# Patient Record
Sex: Male | Born: 1970 | Race: White | Hispanic: Yes | Marital: Married | Smoking: Never smoker
Health system: Southern US, Community
[De-identification: ages and names within clinical notes are randomized; demographics above are authoritative.]

## PROBLEM LIST (undated history)

## (undated) DIAGNOSIS — I10 Essential (primary) hypertension: Secondary | ICD-10-CM

## (undated) DIAGNOSIS — F419 Anxiety disorder, unspecified: Secondary | ICD-10-CM

## (undated) DIAGNOSIS — N189 Chronic kidney disease, unspecified: Secondary | ICD-10-CM

## (undated) DIAGNOSIS — Q613 Polycystic kidney, unspecified: Secondary | ICD-10-CM

## (undated) DIAGNOSIS — F4310 Post-traumatic stress disorder, unspecified: Secondary | ICD-10-CM

## (undated) DIAGNOSIS — E1129 Type 2 diabetes mellitus with other diabetic kidney complication: Secondary | ICD-10-CM

## (undated) DIAGNOSIS — N184 Chronic kidney disease, stage 4 (severe): Secondary | ICD-10-CM

## (undated) DIAGNOSIS — E119 Type 2 diabetes mellitus without complications: Secondary | ICD-10-CM

## (undated) HISTORY — DX: Essential (primary) hypertension: I10

## (undated) HISTORY — DX: Chronic kidney disease, unspecified: N18.9

## (undated) HISTORY — PX: FOREIGN BODY REMOVAL: SHX962

---

## 2019-03-19 ENCOUNTER — Ambulatory Visit
Admission: EM | Admit: 2019-03-19 | Discharge: 2019-03-19 | Disposition: A | Discharge status: Home / Self Care | Payer: BC Managed Care – PPO | Attending: Physician Assistant | Admitting: Physician Assistant

## 2019-03-19 DIAGNOSIS — R3129 Other microscopic hematuria: Secondary | ICD-10-CM | POA: Diagnosis present

## 2019-03-19 DIAGNOSIS — R82998 Other abnormal findings in urine: Secondary | ICD-10-CM | POA: Diagnosis not present

## 2019-03-19 HISTORY — DX: Type 2 diabetes mellitus without complications: E11.9

## 2019-03-19 HISTORY — DX: Polycystic kidney, unspecified: Q61.3

## 2019-03-19 LAB — POCT URINALYSIS DIP (MANUAL ENTRY)
Bilirubin, UA: NEGATIVE
Glucose, UA: NEGATIVE mg/dL
Ketones, POC UA: NEGATIVE mg/dL
Nitrite, UA: NEGATIVE
Protein Ur, POC: 100 mg/dL — AB
Spec Grav, UA: 1.025 (ref 1.010–1.025)
Urobilinogen, UA: 0.2 E.U./dL
pH, UA: 5.5 (ref 5.0–8.0)

## 2019-03-19 MED ORDER — CEPHALEXIN 500 MG PO CAPS: 500.0000 mg | ORAL_CAPSULE | Freq: Three times a day (TID) | ORAL | 0 refills | Status: DC

## 2019-03-19 NOTE — Discharge Instructions (Signed)
Urine showed mild bacteria, blood. Start keflex as directed. Keep hydrated, urine should be clear to pale yellow in color. Continue to monitor symptoms. If right lower quadrant pain returns, nausea/vomiting, fever, go to the emergency department for further evaluation needed.

## 2019-03-19 NOTE — ED Triage Notes (Signed)
Pt c/o rt flank pain that has now subsided, c/o dark urine x3 days

## 2019-03-19 NOTE — ED Provider Notes (Signed)
[facility]    CSN: [id number] Arrival date & time: 03/19/19  1012      History   Chief Complaint Chief Complaint  Patient presents with  . Flank Pain    HPI Jeffery Hobbs is a 49 y.o. male.   49 year old male with history of diabetes, polycystic kidneys comes in for 3-day history of dark urine.  States in the past, when this happens, can be developing urinary tract infection.  Started having right flank pain/RLQ during work, that has now resolved.  States this is usually when one of his cysts ruptured.  He recently moved from [place] to [place], and talked with his nephrologist back in [place], who recommended dipstick and antibiotic for symptoms.  His creatinine is being monitored by nephrologist, and states has been stable.  Diabetes has been stable, used to be on insulin, which has been discontinued due to good control.  Last A1c 6.2.  Denies nausea, vomiting.  Denies fever, chills, body aches.  Denies dysuria, urinary frequency.  Feels that dark urine is due to hematuria.  Has been increasing fluid intake without relief.      Past Medical History:  Diagnosis Date  . Diabetes mellitus without complication ([place])   . Polycystic kidney     There are no problems to display for this patient.   History reviewed. No pertinent surgical history.     Home Medications    Prior to Admission medications   Medication Sig Start Date End Date Taking? Authorizing Provider  amLODipine (NORVASC) 10 MG tablet Take 10 mg by mouth daily.   Yes [provider]  aspirin EC 81 MG tablet Take 81 mg by mouth daily.   Yes [provider]  cholecalciferol (VITAMIN D3) 25 MCG (1000 UNIT) tablet Take 1,000 Units by mouth daily.   Yes [provider]  glipiZIDE (GLUCOTROL) 10 MG tablet Take 5 mg by mouth daily before breakfast.   Yes [provider]  cephALEXin (KEFLEX) 500 MG capsule Take 1 capsule (500 mg total) by mouth 3 (three) times daily.  03/19/19   Ok Edwards, PA-C    Family History History reviewed. No pertinent family history.  Social History Social History   Tobacco Use  . Smoking status: Never Smoker  . Smokeless tobacco: Never Used  Substance Use Topics  . Alcohol use: Yes    Comment: socia   . Drug use: Not Currently     Allergies   Patient has no known allergies.   Review of Systems Review of Systems  Reason unable to perform ROS: See HPI as above.     Physical Exam Triage Vital Signs ED Triage Vitals  Enc Vitals Group     BP 03/19/19 1024 137/85     Pulse Rate 03/19/19 1024 (!) 106     Resp 03/19/19 1024 18     Temp 03/19/19 1024 98.6 F (37 C)     Temp Source 03/19/19 1024 Oral     SpO2 03/19/19 1024 95 %     Weight --      Height --      Head Circumference --      Peak Flow --      Pain Score 03/19/19 1025 0     Pain Loc --      Pain Edu? --      Excl. in [place]? --    No data found.  Updated Vital Signs BP 137/85 (BP Location: Left Arm)   Pulse (!) 106  Temp 98.6 F (37 C) (Oral)   Resp 18   SpO2 95%   Physical Exam Constitutional:      General: He is not in acute distress.    Appearance: Normal appearance. He is not ill-appearing, toxic-appearing or diaphoretic.  HENT:     Head: Normocephalic and atraumatic.  Cardiovascular:     Rate and Rhythm: Normal rate and regular rhythm.  Pulmonary:     Effort: Pulmonary effort is normal. No respiratory distress.     Comments: LCTAB Abdominal:     General: Bowel sounds are normal.     Palpations: Abdomen is soft.     Tenderness: There is no abdominal tenderness. There is no right CVA tenderness, left CVA tenderness, guarding or rebound.  Musculoskeletal:     Cervical back: Normal range of motion and neck supple.  Skin:    General: Skin is warm and dry.  Neurological:     Mental Status: He is alert and oriented to person, place, and time.      UC Treatments / Results  Labs (all labs ordered are listed, but only abnormal  results are displayed) Labs Reviewed  POCT URINALYSIS DIP (MANUAL ENTRY) - Abnormal; Notable for the following components:      Result Value   Color, UA other (*)    Clarity, UA cloudy (*)    Blood, UA large (*)    Protein Ur, POC =100 (*)    Leukocytes, UA Trace (*)    All other components within normal limits  URINE CULTURE    EKG   Radiology No results found.  Procedures Procedures (including critical care time)  Medications Ordered in UC Medications - No data to display  Initial Impression / Assessment and Plan / UC Course  I have reviewed the triage vital signs and the nursing notes.  Pertinent labs & imaging results that were available during my care of the patient were reviewed by me and considered in my medical decision making (see chart for details).    Dipstick with large blood, trace leukocytes.  Given history, will cover for cystitis with Keflex.  Urine culture sent.  Patient had endorsed some right lower quadrant pain when having right flank pain, both have resolved at this time.  Abdomen soft, nontender.  Push fluids.  Return precautions given.  Patient expresses understanding and agrees to plan.  Final Clinical Impressions(s) / UC Diagnoses   Final diagnoses:  Other microscopic hematuria  Dark urine    ED Prescriptions    Medication Sig Dispense Auth. Provider   cephALEXin (KEFLEX) 500 MG capsule Take 1 capsule (500 mg total) by mouth 3 (three) times daily. 21 capsule Ok Edwards, PA-C     PDMP not reviewed this encounter.   Ok Edwards, PA-C 03/19/19 1055

## 2019-03-21 LAB — URINE CULTURE: Culture: 30000 — AB

## 2019-04-13 ENCOUNTER — Ambulatory Visit
Admission: EM | Admit: 2019-04-13 | Discharge: 2019-04-13 | Disposition: A | Discharge status: Home / Self Care | Payer: BC Managed Care – PPO | Attending: Family Medicine | Admitting: Family Medicine

## 2019-04-13 DIAGNOSIS — G8929 Other chronic pain: Secondary | ICD-10-CM

## 2019-04-13 DIAGNOSIS — M25561 Pain in right knee: Secondary | ICD-10-CM | POA: Diagnosis not present

## 2019-04-13 DIAGNOSIS — R319 Hematuria, unspecified: Secondary | ICD-10-CM

## 2019-04-13 LAB — POCT URINALYSIS DIP (MANUAL ENTRY)
Bilirubin, UA: NEGATIVE
Glucose, UA: NEGATIVE mg/dL
Ketones, POC UA: NEGATIVE mg/dL
Leukocytes, UA: NEGATIVE
Nitrite, UA: NEGATIVE
Protein Ur, POC: 100 mg/dL — AB
Spec Grav, UA: >=1.03 — AB (ref 1.010–1.025)
Urobilinogen, UA: 0.2 E.U./dL
pH, UA: 6 (ref 5.0–8.0)

## 2019-04-13 MED ORDER — TRAMADOL HCL 50 MG PO TABS: 50.0000 mg | ORAL_TABLET | Freq: Four times a day (QID) | ORAL | 0 refills | Status: AC | PRN

## 2019-04-13 MED ORDER — PREDNISONE 10 MG (21) PO TBPK: ORAL_TABLET | ORAL | 0 refills | Status: DC

## 2019-04-13 NOTE — ED Triage Notes (Signed)
Pt c/o lt knee pain with swelling x3 days. Denies injury, states chronic arthritis to that knee. Pt c/o lt flank pain with urinary difficulty with decrease flow and cloudy to pink in color. Hx of kidney stones.

## 2019-04-13 NOTE — Discharge Instructions (Addendum)
Treating you for knee pain and swelling due to your osteoarthritis  Wear the ace wrap, rest, ice, compression.  Prednisone daily with food for 6 days Tramadol for more severe pain.  Make sure that you are drinking plenty of fluids.  Contacts given for primary care, orthopedics and nephrology.

## 2019-04-13 NOTE — ED Provider Notes (Signed)
[facility]    CSN: [id number] Arrival date & time: 04/13/19  1131      History   Chief Complaint Chief Complaint  Patient presents with  . Knee Pain  . Flank Pain    HPI Jeffery Hobbs is a 49 y.o. male.   Pt is a 49 year old male that presents today with right knee pain swelling.  This is been constant and worsening of the past 3 days.  Reporting pretty labored struggle with standing and wearing heavy vest and done at work.  Has history of chronic pain in that knee due to osteoarthritis.  Has had steroid injections in the knee before.  Currently has not take anything for this flare.  Has any injuries to the knee.  Any erythema or fevers.  Reports has been told he needs a knee replacement in the future  Patient also having left flank pain.  This has been a chronic problem for him also has been coming and going.  Was seen here on 3 1 with similar symptoms and treated for possible kidney stone and urinary tract infection.  Reporting had improvement after the treatment.  He has had decreased urinary flow and discolored urine.  Reports the past couple days the urine color has cleared up some.  Initially was cranberry colored.  Denies any nausea, vomiting or diarrhea.  ROS per HPI      Past Medical History:  Diagnosis Date  . Diabetes mellitus without complication ([place])   . Polycystic kidney     There are no problems to display for this patient.   History reviewed. No pertinent surgical history.     Home Medications    Prior to Admission medications   Medication Sig Start Date End Date Taking? Authorizing Provider  amLODipine (NORVASC) 10 MG tablet Take 10 mg by mouth daily.    [provider]  aspirin EC 81 MG tablet Take 81 mg by mouth daily.    [provider]  cholecalciferol (VITAMIN D3) 25 MCG (1000 UNIT) tablet Take 1,000 Units by mouth daily.    [provider]  glipiZIDE (GLUCOTROL) 10 MG tablet Take 5 mg by mouth daily  before breakfast.    [provider]  predniSONE (STERAPRED UNI-PAK 21 TAB) 10 MG (21) TBPK tablet 6 tabs for 1 day, then 5 tabs for 1 das, then 4 tabs for 1 day, then 3 tabs for 1 day, 2 tabs for 1 day, then 1 tab for 1 day 04/13/19   Loura Halt A, NP  traMADol (ULTRAM) 50 MG tablet Take 1 tablet (50 mg total) by mouth every 6 (six) hours as needed for up to 3 days. 04/13/19 04/16/19  Orvan July, NP    Family History History reviewed. No pertinent family history.  Social History Social History   Tobacco Use  . Smoking status: Never Smoker  . Smokeless tobacco: Never Used  Substance Use Topics  . Alcohol use: Yes    Comment: socia   . Drug use: Not Currently     Allergies   Patient has no known allergies.   Review of Systems Review of Systems   Physical Exam Triage Vital Signs ED Triage Vitals [04/13/19 1146]  Enc Vitals Group     BP (!) 163/98     Pulse Rate 94     Resp 16     Temp 98.4 F (36.9 C)     Temp Source Oral     SpO2 96 %  Weight      Height      Head Circumference      Peak Flow      Pain Score 10     Pain Loc      Pain Edu?      Excl. in [place]?    No data found.  Updated Vital Signs BP (!) 163/98 (BP Location: Left Arm)   Pulse 94   Temp 98.4 F (36.9 C) (Oral)   Resp 16   SpO2 96%   Visual Acuity Right Eye Distance:   Left Eye Distance:   Bilateral Distance:    Right Eye Near:   Left Eye Near:    Bilateral Near:     Physical Exam Vitals and nursing note reviewed.  Constitutional:      General: He is not in acute distress.    Appearance: Normal appearance. He is not ill-appearing, toxic-appearing or diaphoretic.  HENT:     Head: Normocephalic and atraumatic.  Pulmonary:     Effort: Pulmonary effort is normal.  Abdominal:     Tenderness: There is left CVA tenderness.  Musculoskeletal:        General: Swelling and tenderness present.     Cervical back: Normal range of motion.     Right knee: Swelling and bony  tenderness present. Decreased range of motion. Tenderness present over the MCL.       Legs:  Skin:    General: Skin is warm and dry.  Neurological:     Mental Status: He is alert.  Psychiatric:        Mood and Affect: Mood normal.      UC Treatments / Results  Labs (all labs ordered are listed, but only abnormal results are displayed) Labs Reviewed  POCT URINALYSIS DIP (MANUAL ENTRY) - Abnormal; Notable for the following components:      Result Value   Spec Grav, UA >=1.030 (*)    Blood, UA large (*)    Protein Ur, POC =100 (*)    All other components within normal limits    EKG   Radiology No results found.  Procedures Procedures (including critical care time)  Medications Ordered in UC Medications - No data to display  Initial Impression / Assessment and Plan / UC Course  I have reviewed the triage vital signs and the nursing notes.  Pertinent labs & imaging results that were available during my care of the patient were reviewed by me and considered in my medical decision making (see chart for details).     Chronic right knee pain due to osteoarthritis-patient with increased pain and swelling over the past 3 days. Does not currently have an orthopedic specialist here in [place].  [place] of state. Will treat with Ace wrap for compression, rest, ice and elevation. Prednisone for pain, inflammation and swelling. Tramadol for severe pain as needed  Left flank pain-, urine with large blood.  No leukocytes or signs of infection. Most likely kidney stone.  Patient has a history of polycystic kidney disease.  He does not have a nephrologist here. Gave patient contact for primary care follow-up for referrals to specialist. Follow up as needed for continued or worsening symptoms  Final Clinical Impressions(s) / UC Diagnoses   Final diagnoses:  Chronic pain of right knee  Hematuria, unspecified type     Discharge Instructions      Treating you for knee pain and swelling due to your osteoarthritis  Wear the ace wrap, rest,  ice, compression.  Prednisone daily with food for 6 days Tramadol for more severe pain.  Make sure that you are drinking plenty of fluids.  Contacts given for primary care, orthopedics and nephrology.      ED Prescriptions    Medication Sig Dispense Auth. Provider   predniSONE (STERAPRED UNI-PAK 21 TAB) 10 MG (21) TBPK tablet 6 tabs for 1 day, then 5 tabs for 1 das, then 4 tabs for 1 day, then 3 tabs for 1 day, 2 tabs for 1 day, then 1 tab for 1 day 21 tablet Mohit Zirbes A, NP   traMADol (ULTRAM) 50 MG tablet Take 1 tablet (50 mg total) by mouth every 6 (six) hours as needed for up to 3 days. 12 tablet Laban Orourke A, NP     I have reviewed the PDMP during this encounter.   Loura Halt A, NP 04/13/19 1328

## 2019-04-23 ENCOUNTER — Other Ambulatory Visit: Payer: Self-pay | Admitting: Sports Medicine

## 2019-04-23 DIAGNOSIS — S59902A Unspecified injury of left elbow, initial encounter: Secondary | ICD-10-CM

## 2019-04-24 ENCOUNTER — Other Ambulatory Visit: Payer: Self-pay

## 2019-04-24 ENCOUNTER — Ambulatory Visit
Admission: RE | Admit: 2019-04-24 | Discharge: 2019-04-24 | Disposition: A | Discharge status: Home / Self Care | Payer: BC Managed Care – PPO | Source: Ambulatory Visit | Attending: Sports Medicine | Admitting: Sports Medicine

## 2019-04-24 DIAGNOSIS — S59902A Unspecified injury of left elbow, initial encounter: Secondary | ICD-10-CM | POA: Diagnosis not present

## 2019-04-25 ENCOUNTER — Other Ambulatory Visit: Payer: Self-pay | Admitting: Orthopedic Surgery

## 2019-04-27 ENCOUNTER — Other Ambulatory Visit
Admission: RE | Admit: 2019-04-27 | Discharge: 2019-04-27 | Disposition: A | Discharge status: Home / Self Care | Payer: BC Managed Care – PPO | Source: Ambulatory Visit | Attending: Orthopedic Surgery | Admitting: Orthopedic Surgery

## 2019-04-27 DIAGNOSIS — Z01812 Encounter for preprocedural laboratory examination: Secondary | ICD-10-CM | POA: Diagnosis not present

## 2019-04-27 DIAGNOSIS — Z20822 Contact with and (suspected) exposure to covid-19: Secondary | ICD-10-CM | POA: Diagnosis not present

## 2019-04-28 LAB — SARS CORONAVIRUS 2 (TAT 6-24 HRS): SARS Coronavirus 2: NEGATIVE

## 2019-04-30 ENCOUNTER — Ambulatory Visit: Payer: BC Managed Care – PPO | Admitting: Anesthesiology

## 2019-04-30 ENCOUNTER — Encounter: Payer: Self-pay | Admitting: Orthopedic Surgery

## 2019-04-30 ENCOUNTER — Other Ambulatory Visit: Payer: Self-pay

## 2019-04-30 ENCOUNTER — Ambulatory Visit
Admission: RE | Admit: 2019-04-30 | Discharge: 2019-04-30 | Disposition: A | Discharge status: Home / Self Care | Payer: BC Managed Care – PPO | Attending: Orthopedic Surgery | Admitting: Orthopedic Surgery

## 2019-04-30 ENCOUNTER — Encounter
Admission: RE | Disposition: A | Discharge status: Home / Self Care | Payer: Self-pay | Source: Home / Self Care | Attending: Orthopedic Surgery

## 2019-04-30 DIAGNOSIS — X500XXA Overexertion from strenuous movement or load, initial encounter: Secondary | ICD-10-CM | POA: Insufficient documentation

## 2019-04-30 DIAGNOSIS — Z7984 Long term (current) use of oral hypoglycemic drugs: Secondary | ICD-10-CM | POA: Insufficient documentation

## 2019-04-30 DIAGNOSIS — E119 Type 2 diabetes mellitus without complications: Secondary | ICD-10-CM | POA: Insufficient documentation

## 2019-04-30 DIAGNOSIS — Z7982 Long term (current) use of aspirin: Secondary | ICD-10-CM | POA: Diagnosis not present

## 2019-04-30 DIAGNOSIS — Z79899 Other long term (current) drug therapy: Secondary | ICD-10-CM | POA: Diagnosis not present

## 2019-04-30 DIAGNOSIS — S46312A Strain of muscle, fascia and tendon of triceps, left arm, initial encounter: Secondary | ICD-10-CM | POA: Insufficient documentation

## 2019-04-30 HISTORY — DX: Post-traumatic stress disorder, unspecified: F43.10

## 2019-04-30 HISTORY — PX: TRICEPS TENDON REPAIR: SHX2577

## 2019-04-30 HISTORY — DX: Anxiety disorder, unspecified: F41.9

## 2019-04-30 LAB — CBC
HCT: 42.5 % (ref 39.0–52.0)
Hemoglobin: 14 g/dL (ref 13.0–17.0)
MCH: 28.7 pg (ref 26.0–34.0)
MCHC: 32.9 g/dL (ref 30.0–36.0)
MCV: 87.3 fL (ref 80.0–100.0)
Platelets: 359 10*3/uL (ref 150–400)
RBC: 4.87 MIL/uL (ref 4.22–5.81)
RDW: 13.2 % (ref 11.5–15.5)
WBC: 6.9 10*3/uL (ref 4.0–10.5)
nRBC: 0 % (ref 0.0–0.2)

## 2019-04-30 LAB — BASIC METABOLIC PANEL
Anion gap: 8 (ref 5–15)
BUN: 33 mg/dL — ABNORMAL HIGH (ref 6–20)
CO2: 22 mmol/L (ref 22–32)
Calcium: 9 mg/dL (ref 8.9–10.3)
Chloride: 104 mmol/L (ref 98–111)
Creatinine, Ser: 2.28 mg/dL — ABNORMAL HIGH (ref 0.61–1.24)
GFR calc Af Amer: 37 mL/min — ABNORMAL LOW (ref >60)
GFR calc non Af Amer: 32 mL/min — ABNORMAL LOW (ref >60)
Glucose, Bld: 192 mg/dL — ABNORMAL HIGH (ref 70–99)
Potassium: 4.4 mmol/L (ref 3.5–5.1)
Sodium: 134 mmol/L — ABNORMAL LOW (ref 135–145)

## 2019-04-30 LAB — GLUCOSE, CAPILLARY
Glucose-Capillary: 180 mg/dL — ABNORMAL HIGH (ref 70–99)
Glucose-Capillary: 161 mg/dL — ABNORMAL HIGH (ref 70–99)

## 2019-04-30 SURGERY — REPAIR, TENDON, TRICEPS
Anesthesia: General | Site: Arm Upper | Laterality: Left

## 2019-04-30 MED ORDER — OXYCODONE HCL 5 MG PO TABS
ORAL_TABLET | ORAL | Status: AC
  Filled 2019-04-30: qty 1

## 2019-04-30 MED ORDER — EPHEDRINE SULFATE 50 MG/ML IJ SOLN
INTRAMUSCULAR | Status: DC | PRN
  Administered 2019-04-30: 10 mg via INTRAVENOUS

## 2019-04-30 MED ORDER — OXYCODONE HCL 5 MG PO TABS
5.0000 mg | ORAL_TABLET | ORAL | Status: DC | PRN
  Administered 2019-04-30: 5 mg via ORAL
  Filled 2019-04-30: qty 2

## 2019-04-30 MED ORDER — FENTANYL CITRATE (PF) 100 MCG/2ML IJ SOLN
INTRAMUSCULAR | Status: DC | PRN
  Administered 2019-04-30 (×2): 50 ug via INTRAVENOUS

## 2019-04-30 MED ORDER — FENTANYL CITRATE (PF) 100 MCG/2ML IJ SOLN
INTRAMUSCULAR | Status: AC
  Administered 2019-04-30: 25 ug via INTRAVENOUS
  Filled 2019-04-30: qty 2

## 2019-04-30 MED ORDER — ACETAMINOPHEN 500 MG PO TABS: 1000.0000 mg | ORAL_TABLET | Freq: Three times a day (TID) | ORAL | 2 refills | Status: DC

## 2019-04-30 MED ORDER — ROCURONIUM BROMIDE 10 MG/ML (PF) SYRINGE
PREFILLED_SYRINGE | INTRAVENOUS | Status: AC
  Filled 2019-04-30: qty 10

## 2019-04-30 MED ORDER — ASPIRIN EC 325 MG PO TBEC: 325.0000 mg | DELAYED_RELEASE_TABLET | Freq: Every day | ORAL | 0 refills | Status: AC

## 2019-04-30 MED ORDER — ONDANSETRON HCL 4 MG/2ML IJ SOLN
INTRAMUSCULAR | Status: AC
  Filled 2019-04-30: qty 2

## 2019-04-30 MED ORDER — MIDAZOLAM HCL 2 MG/2ML IJ SOLN
INTRAMUSCULAR | Status: AC
  Filled 2019-04-30: qty 2

## 2019-04-30 MED ORDER — FAMOTIDINE 20 MG PO TABS: 20.0000 mg | ORAL_TABLET | Freq: Once | ORAL | Status: AC

## 2019-04-30 MED ORDER — PROPOFOL 10 MG/ML IV BOLUS
INTRAVENOUS | Status: AC
  Filled 2019-04-30: qty 40

## 2019-04-30 MED ORDER — PHENYLEPHRINE HCL (PRESSORS) 10 MG/ML IV SOLN
INTRAVENOUS | Status: DC | PRN
  Administered 2019-04-30 (×4): 100 ug via INTRAVENOUS

## 2019-04-30 MED ORDER — ROCURONIUM BROMIDE 100 MG/10ML IV SOLN
INTRAVENOUS | Status: DC | PRN
  Administered 2019-04-30: 50 mg via INTRAVENOUS
  Administered 2019-04-30: 20 mg via INTRAVENOUS

## 2019-04-30 MED ORDER — LACTATED RINGERS IV SOLN: INTRAVENOUS | Status: DC

## 2019-04-30 MED ORDER — PROPOFOL 10 MG/ML IV BOLUS
INTRAVENOUS | Status: DC | PRN
  Administered 2019-04-30: 200 mg via INTRAVENOUS

## 2019-04-30 MED ORDER — CEFAZOLIN SODIUM-DEXTROSE 2-4 GM/100ML-% IV SOLN
INTRAVENOUS | Status: AC
  Filled 2019-04-30: qty 100

## 2019-04-30 MED ORDER — BUPIVACAINE LIPOSOME 1.3 % IJ SUSP
INTRAMUSCULAR | Status: AC
  Filled 2019-04-30: qty 20

## 2019-04-30 MED ORDER — NEOMYCIN-POLYMYXIN B GU 40-200000 IR SOLN
Status: DC | PRN
  Administered 2019-04-30: 4 mL

## 2019-04-30 MED ORDER — FAMOTIDINE 20 MG PO TABS
ORAL_TABLET | ORAL | Status: AC
  Administered 2019-04-30: 20 mg via ORAL
  Filled 2019-04-30: qty 1

## 2019-04-30 MED ORDER — BUPIVACAINE LIPOSOME 1.3 % IJ SUSP
INTRAMUSCULAR | Status: DC | PRN
  Administered 2019-04-30: 11 mL

## 2019-04-30 MED ORDER — ONDANSETRON HCL 4 MG/2ML IJ SOLN: 4.0000 mg | Freq: Once | INTRAMUSCULAR | Status: DC | PRN

## 2019-04-30 MED ORDER — BUPIVACAINE HCL (PF) 0.5 % IJ SOLN
INTRAMUSCULAR | Status: AC
  Filled 2019-04-30: qty 30

## 2019-04-30 MED ORDER — BUPIVACAINE HCL (PF) 0.5 % IJ SOLN
INTRAMUSCULAR | Status: DC | PRN
  Administered 2019-04-30: 11 mL

## 2019-04-30 MED ORDER — LIDOCAINE HCL (CARDIAC) PF 100 MG/5ML IV SOSY
PREFILLED_SYRINGE | INTRAVENOUS | Status: DC | PRN
  Administered 2019-04-30: 100 mg via INTRAVENOUS

## 2019-04-30 MED ORDER — FENTANYL CITRATE (PF) 100 MCG/2ML IJ SOLN
INTRAMUSCULAR | Status: AC
  Filled 2019-04-30: qty 2

## 2019-04-30 MED ORDER — OXYCODONE HCL 5 MG PO TABS: 5.0000 mg | ORAL_TABLET | ORAL | 0 refills | Status: DC | PRN

## 2019-04-30 MED ORDER — DEXAMETHASONE SODIUM PHOSPHATE 10 MG/ML IJ SOLN
INTRAMUSCULAR | Status: DC | PRN
  Administered 2019-04-30: 5 mg via INTRAVENOUS

## 2019-04-30 MED ORDER — SUGAMMADEX SODIUM 200 MG/2ML IV SOLN
INTRAVENOUS | Status: DC | PRN
  Administered 2019-04-30: 200 mg via INTRAVENOUS

## 2019-04-30 MED ORDER — SODIUM CHLORIDE 0.9 % IV SOLN
INTRAVENOUS | Status: DC
  Administered 2019-04-30: 50 mL/h via INTRAVENOUS

## 2019-04-30 MED ORDER — ONDANSETRON 4 MG PO TBDP: 4.0000 mg | ORAL_TABLET | Freq: Three times a day (TID) | ORAL | 0 refills | Status: DC | PRN

## 2019-04-30 MED ORDER — DEXAMETHASONE SODIUM PHOSPHATE 10 MG/ML IJ SOLN
INTRAMUSCULAR | Status: AC
  Filled 2019-04-30: qty 1

## 2019-04-30 MED ORDER — MIDAZOLAM HCL 2 MG/2ML IJ SOLN
INTRAMUSCULAR | Status: DC | PRN
  Administered 2019-04-30: 2 mg via INTRAVENOUS

## 2019-04-30 MED ORDER — NEOMYCIN-POLYMYXIN B GU 40-200000 IR SOLN
Status: AC
  Filled 2019-04-30: qty 4

## 2019-04-30 MED ORDER — DEXMEDETOMIDINE HCL 200 MCG/2ML IV SOLN
INTRAVENOUS | Status: DC | PRN
  Administered 2019-04-30 (×2): 4 ug via INTRAVENOUS
  Administered 2019-04-30: 12 ug via INTRAVENOUS

## 2019-04-30 MED ORDER — FENTANYL CITRATE (PF) 100 MCG/2ML IJ SOLN
25.0000 ug | INTRAMUSCULAR | Status: DC | PRN
  Administered 2019-04-30 (×2): 25 ug via INTRAVENOUS

## 2019-04-30 MED ORDER — CEFAZOLIN SODIUM-DEXTROSE 2-4 GM/100ML-% IV SOLN
2.0000 g | INTRAVENOUS | Status: AC
  Administered 2019-04-30: 2 g via INTRAVENOUS

## 2019-04-30 MED ORDER — ONDANSETRON HCL 4 MG/2ML IJ SOLN
INTRAMUSCULAR | Status: DC | PRN
  Administered 2019-04-30: 4 mg via INTRAVENOUS

## 2019-04-30 MED ORDER — LIDOCAINE HCL (PF) 2 % IJ SOLN
INTRAMUSCULAR | Status: AC
  Filled 2019-04-30: qty 10

## 2019-04-30 SURGICAL SUPPLY — 57 items
BLADE SURG 15 STRL LF DISP TIS (BLADE) ×1 IMPLANT
BLADE SURG 15 STRL SS (BLADE) ×1
BLADE SURG SZ10 CARB STEEL (BLADE) ×4 IMPLANT
BNDG COHESIVE 4X5 TAN STRL (GAUZE/BANDAGES/DRESSINGS) ×2 IMPLANT
BNDG ESMARK 4X12 TAN STRL LF (GAUZE/BANDAGES/DRESSINGS) ×2 IMPLANT
BUR 4.8X51.2 (BURR) IMPLANT
CANISTER SUCT 1200ML W/VALVE (MISCELLANEOUS) ×2 IMPLANT
CHLORAPREP W/TINT 26 (MISCELLANEOUS) ×2 IMPLANT
COVER WAND RF STERILE (DRAPES) ×2 IMPLANT
CUFF TOURN SGL QUICK 18X4 (TOURNIQUET CUFF) IMPLANT
CUFF TOURN SGL QUICK 24 (TOURNIQUET CUFF) ×1
CUFF TRNQT CYL 24X4X16.5-23 (TOURNIQUET CUFF) IMPLANT
DRAPE INCISE IOBAN 66X60 STRL (DRAPES) ×4 IMPLANT
DRAPE SPLIT 6X30 W/TAPE (DRAPES) ×2 IMPLANT
ELECT REM PT RETURN 9FT ADLT (ELECTROSURGICAL) ×2
ELECTRODE REM PT RTRN 9FT ADLT (ELECTROSURGICAL) ×1 IMPLANT
GAUZE SPONGE 4X4 12PLY STRL (GAUZE/BANDAGES/DRESSINGS) ×3 IMPLANT
GAUZE XEROFORM 1X8 LF (GAUZE/BANDAGES/DRESSINGS) ×2 IMPLANT
GLOVE INDICATOR 8.0 STRL GRN (GLOVE) ×2 IMPLANT
GLOVE SURG ORTHO 8.0 STRL STRW (GLOVE) ×6 IMPLANT
GOWN STRL REUS W/ TWL LRG LVL3 (GOWN DISPOSABLE) ×1 IMPLANT
GOWN STRL REUS W/ TWL XL LVL3 (GOWN DISPOSABLE) ×1 IMPLANT
GOWN STRL REUS W/TWL LRG LVL3 (GOWN DISPOSABLE) ×1
GOWN STRL REUS W/TWL XL LVL3 (GOWN DISPOSABLE) ×1
IMP SYS 2ND FIX PEEK 4.75X19.1 (Miscellaneous) ×2 IMPLANT
IMPL SYS 2ND FX PEEK 4.75X19.1 (Miscellaneous) IMPLANT
KIT TURNOVER KIT A (KITS) ×2 IMPLANT
NDL FILTER BLUNT 18X1 1/2 (NEEDLE) ×1 IMPLANT
NDL MAYO CATGUT SZ5 (NEEDLE) ×1
NDL SUT 5 .5 CRC TPR PNT MAYO (NEEDLE) ×1 IMPLANT
NEEDLE FILTER BLUNT 18X 1/2SAF (NEEDLE) ×1
NEEDLE FILTER BLUNT 18X1 1/2 (NEEDLE) ×1 IMPLANT
NS IRRIG 500ML POUR BTL (IV SOLUTION) ×2 IMPLANT
PACK EXTREMITY ARMC (MISCELLANEOUS) ×2 IMPLANT
PAD ABD DERMACEA PRESS 5X9 (GAUZE/BANDAGES/DRESSINGS) ×2 IMPLANT
PADDING CAST BLEND 4X4 NS (MISCELLANEOUS) ×2 IMPLANT
PASSER SUT SWANSON 36MM LOOP (INSTRUMENTS) ×1 IMPLANT
PENCIL SMOKE EVACUATOR COATED (MISCELLANEOUS) ×1 IMPLANT
RETRIEVER SUT HEWSON (MISCELLANEOUS) ×2 IMPLANT
SLING ARM LRG DEEP (SOFTGOODS) ×3 IMPLANT
SPLINT FAST PLASTER 5X30 (CAST SUPPLIES) ×1
SPLINT PLASTER CAST FAST 5X30 (CAST SUPPLIES) ×1 IMPLANT
SPONGE LAP 18X18 RF (DISPOSABLE) ×2 IMPLANT
STAPLER SKIN PROX 35W (STAPLE) ×2 IMPLANT
STOCKINETTE IMPERVIOUS 9X36 MD (GAUZE/BANDAGES/DRESSINGS) ×2 IMPLANT
SUT FIBERWIRE #2 38 T-5 BLUE (SUTURE) ×2
SUT FIBERWIRE #5 38 BLUE (WIRE) ×3 IMPLANT
SUT VIC AB 1 CT1 36 (SUTURE) ×2 IMPLANT
SUT VIC AB 2-0 CT1 27 (SUTURE) ×2
SUT VIC AB 2-0 CT1 TAPERPNT 27 (SUTURE) ×2 IMPLANT
SUTURE FIBERWR #2 38 T-5 BLUE (SUTURE) ×1 IMPLANT
SUTURE TAPE 1.3 40 TPR END (SUTURE) ×2 IMPLANT
SUTURE TAPE FIBERLINK 1.3 LOOP (SUTURE) ×1 IMPLANT
SUTURETAPE 1.3 40 TPR END (SUTURE) ×4
SUTURETAPE FIBERLINK 1.3 LOOP (SUTURE) ×6
SYR 5ML LL (SYRINGE) ×2 IMPLANT
TOWEL OR 17X26 4PK STRL BLUE (TOWEL DISPOSABLE) ×2 IMPLANT

## 2019-04-30 NOTE — Anesthesia Preprocedure Evaluation (Addendum)
Anesthesia Evaluation  Patient identified by MRN, date of birth, ID band Patient awake    Reviewed: Allergy & Precautions, NPO status , Patient's Chart, lab work & pertinent test results  History of Anesthesia Complications Negative for: history of anesthetic complications  Airway Mallampati: II       Dental  (+) Poor Dentition, Chipped   Pulmonary neg sleep apnea, neg COPD, Not current smoker,           Cardiovascular (-) hypertension(-) Past MI and (-) CHF (-) dysrhythmias (-) Valvular Problems/Murmurs     Neuro/Psych neg Seizures Anxiety    GI/Hepatic Neg liver ROS, neg GERD  ,  Endo/Other  diabetes, Type 2, Oral Hypoglycemic Agents  Renal/GU Renal disease (polycystic kidney dz)     Musculoskeletal   Abdominal   Peds  Hematology   Anesthesia Other Findings   Reproductive/Obstetrics                            Anesthesia Physical Anesthesia Plan  ASA: III  Anesthesia Plan: General   Post-op Pain Management:    Induction: Intravenous  PONV Risk Score and Plan: 2 and Ondansetron and Midazolam  Airway Management Planned: Oral ETT  Additional Equipment:   Intra-op Plan:   Post-operative Plan:   Informed Consent: I have reviewed the patients History and Physical, chart, labs and discussed the procedure including the risks, benefits and alternatives for the proposed anesthesia with the patient or authorized representative who has indicated his/her understanding and acceptance.       Plan Discussed with:   Anesthesia Plan Comments:         Anesthesia Quick Evaluation

## 2019-04-30 NOTE — Transfer of Care (Signed)
Immediate Anesthesia Transfer of Care Note  Patient: Jeffery Hobbs  Procedure(s) Performed: LEFT TRICEPS REPAIR (Left Arm Upper)  Patient Location: PACU  Anesthesia Type:General  Level of Consciousness: drowsy and patient cooperative  Airway & Oxygen Therapy: Patient Spontanous Breathing and Patient connected to face mask oxygen  Post-op Assessment: Report given to RN and Post -op Vital signs reviewed and stable  Post vital signs: Reviewed and stable  Last Vitals:  Vitals Value Taken Time  BP 136/91 04/30/19 1534  Temp    Pulse 88 04/30/19 1534  Resp 19 04/30/19 1534  SpO2 98 % 04/30/19 1534  Vitals shown include unvalidated device data.  Last Pain:  Vitals:   04/30/19 1053  TempSrc: Tympanic  PainSc: 3       Patients Stated Pain Goal: 1 (62/03/55 9741)  Complications: No apparent anesthesia complications

## 2019-04-30 NOTE — Op Note (Signed)
DATE OF SURGERY: 04/30/2019  PRE-OP DIAGNOSIS:  1. Left Triceps Rupture   POST-OP DIAGNOSIS:  1. Left Triceps Rupture  PROCEDURES:  1. Left Triceps Repair  SURGEON: Cato Mulligan, MD  ASSISTANT(S): Benson Norway, PA-s  ANESTHESIA: Gen  TOTAL IV FLUIDS: see anesthesia record  ESTIMATED BLOOD LOSS: 50cc  TOURNIQUET TIME: 68 min  DRAINS:  none  SPECIMENS: None.  IMPLANTS:  Arthrex 4.54mm SwiveLock x1 with 2 SutureTapes  COMPLICATIONS: None apparent.  INDICATIONS: Jeffery Hobbs is a 49 y.o. male who was lifting some boxes while moving and felt a pop about his left elbow ~ 2 weeks ago. The patient was weak with elbow extension, and on exam, there was a gap in the triceps tendon at the level of the olecranon. MRI confirmed complete tear of the lateral and long heads of the triceps off of the olecranon. The deep medial head was intact. Surgery was recommended for triceps repair to most predictably improve his pain and function long term. After discussion of risks, benefits, and alternatives, the patient agreed to proceed with surgery.  DETAILS OF PROCEDURE:  The patient was brought to the operating room and placed on the table. Anesthesia was administered. The patient was then transferred to the lateral position on a pegboard. Operative arm was placed in an arm holder so elbow rested at 90 degrees. Arm was prescrubbed with Hibiclens and alcohol, prepped with ChloraPrep and draped in the usual sterile fashion. He was given preoperative IV antibiotics within 30 minutes of the start of the case, and a surgical time-out occurred. A sterile, well-padded tourniquet was placed.   Arm was elevated, exsanguinated with an Esmarch bandage and tourniquet inflated to 277mmHg. A midline incision was created about the elbow curving laterally to avoid the tip of the olecranon. We sharply dissected down to the level of the obvious tear of the triceps tendon. We verified that the deep, medial head was  intact.  The footprint of the tendon on the olecranon was cleared of soft tissue with a rongeur and a bony trough was created. Two #2 Suturetape sutures were passed in a [place] fashion exiting just proximal to the triceps insertion. Two FiberLink loops were passed through the triceps insertion as well to be used as passing sutures. Next, medial and lateral transosseous tunnels were drilled parallel to each other from the triceps footprint on the olecranon distally out of the posterior ulna. We then drilled and tapped for a 4.4mm SwiveLock in between the two transosseous tunnels.  A suture passer was used to pull the medial [place] with medial FiberLink loop through the medial tunnel. The same thing was done for the lateral tunnel. Next, one medial and one lateral [place] was passed through the medial  Fiberlink loop and shuttle through the medial tunnel. The same was performed for the lateral tunnel. This created a box configuration to allow for tendon compression at the footprint. The two [place] strands out of the medial tunnel were passed through the SwiveLock anchor, and the two [place] strands out of the lateral tunnel were passed through the SwiveLock anchor in the opposite direction. We tensioned the sutures with the eyelet of the anchor at the previously drilled hole and advanced the anchor until it was flush with the posterior cortex of the ulna.  The #2 FiberWire sutures out of the anchor were placed into the triceps to further advance a small area of the defect down to its footprint. Further tendon reapproximation was performed with #1 Vicyrl sutures.  We then confirmed that excellent reapproximation and fixation were achieved. We were able to achieve 0-30 degrees elbow motion without significant tension on the elbow.  The elbow was held in extension the rest of the surgery.   The subdermal layer was closed with 2-0 vicryl. The skin was closed with staples. The wound was dressed with Xeroform, fluffs, ABD,  and cotton wrap. The tourniquet was let down. A splint was applied .   Instrument, sponge, and needle counts were correct prior to wound closure and at the conclusion of the case. The patient was then awakened from anesthesia without complication   POST-OPERATIVE PLAN: Patient will be discharged to home. The patient will be NWB in splint for 2 weeks and then transitioned to hinged elbow brace.  Follow up in 2 weeks as an outpatient. Start PT after 2 week appointment.Marland Kitchen

## 2019-04-30 NOTE — H&P (Signed)
Paper H&P to be scanned into permanent record. H&P reviewed. No significant changes noted.  

## 2019-04-30 NOTE — Anesthesia Procedure Notes (Signed)
Procedure Name: Intubation Date/Time: 04/30/2019 1:23 PM Performed by: Lia Foyer, CRNA Pre-anesthesia Checklist: Patient identified, Emergency Drugs available, Patient being monitored and Suction available Patient Re-evaluated:Patient Re-evaluated prior to induction Oxygen Delivery Method: Circle system utilized Preoxygenation: Pre-oxygenation with 100% oxygen Induction Type: IV induction Ventilation: Mask ventilation with difficulty Laryngoscope Size: McGraph and 4 Grade View: Grade I Tube type: Oral Tube size: 7.5 mm Number of attempts: 1 Airway Equipment and Method: Stylet Placement Confirmation: ETT inserted through vocal cords under direct vision,  positive ETCO2 and breath sounds checked- equal and bilateral Secured at: 22 cm Tube secured with: Tape Dental Injury: Teeth and Oropharynx as per pre-operative assessment

## 2019-04-30 NOTE — Discharge Instructions (Addendum)
Elbow Surgery Post-Op Instructions  1. Splint/Cast: You will have a splint (3/4 cast) on your arm after surgery. Ensure that this remains clean and dry until follow up appointment. If this becomes wet, you need to call our offices to get it changed or else you risk skin breakdown.    2. Driving:  Plan on not driving for at least 2 weeks. Please note that you are advised NOT to drive while taking narcotic pain medications as you may be impaired and unsafe to drive.  3. Activity: Weight bearing: Non-weight bearing. Use sling for comfort as needed.    4. Medications:  - You have been provided a prescription for narcotic pain medicine. After surgery, take 1-2 narcotic tablets every 4 hours if needed for severe pain. Please start this as soon as you begin to start having pain (if you received a nerve block, start taking as soon as this wears off).  - A prescription for anti-nausea medication will be provided in case the narcotic medicine causes nausea - take 1 tablet every 6 hours only if nauseated.  -Take tylenol 1000 mg every 8 hours for pain.  May stop tylenol 5 days after surgery if you are having minimal pain.  If you are taking prescription medication for anxiety, depression, insomnia, muscle spasm, chronic pain, or for attention deficit disorder you are advised that you are at a higher risk of adverse effects with use of narcotics post-op, including narcotic addiction/dependence, depressed breathing, death. If you use non-prescribed substances: alcohol, marijuana, cocaine, heroin, methamphetamines, etc., you are at a higher risk of adverse effects with use of narcotics post-op, including narcotic addiction/dependence, depressed breathing, death. You are advised that taking > 50 morphine milligram equivalents (MME) of narcotic pain medication per day results in twice the risk of overdose or death. For your prescription provided: oxycodone 5 mg - taking more than 6 tablets per day. Be advised  that we will prescribe narcotics short-term, for acute post-operative pain only.  6. Physical Therapy: Plan to start after follow up appointment at 2 weeks. 1-2 times per week for ~12-16 weeks.  The therapist will provide home exercises. Please contact our offices if this appointment has not been scheduled.   7. Work/School: May do light duty/desk job or return to school in approximately 1-2 weeks when off of narcotics, pain is well-controlled, and swelling has decreased. May not return to full work if this includes any lifting for at least 3 months.   8. Post-Op Appointments: Your first post-op appointment will be with Dr. Posey Pronto in approximately 2 weeks time. If given to you, please bring brace to your 1st post-operative appointment.   If you find that they have not been scheduled please call the Orthopaedic Appointment front desk at [phone].   AMBULATORY SURGERY  DISCHARGE INSTRUCTIONS   1) The drugs that you were given will stay in your system until tomorrow so for the next 24 hours you should not:  A) Drive an automobile B) Make any legal decisions C) Drink any alcoholic beverage   2) You may resume regular meals tomorrow.  Today it is better to start with liquids and gradually work up to solid foods.  You may eat anything you prefer, but it is better to start with liquids, then soup and crackers, and gradually work up to solid foods.   3) Please notify your doctor immediately if you have any unusual bleeding, trouble breathing, redness and pain at the surgery site, drainage, fever, or pain not relieved  by medication.    4) Additional Instructions:        Please contact your physician with any problems or Same Day Surgery at [phone], Monday through Friday 6 am to 4 pm, or [place] [facility] number at [phone].

## 2019-05-01 NOTE — Anesthesia Postprocedure Evaluation (Addendum)
Anesthesia Post Note  Patient: Jeffery Hobbs  Procedure(s) Performed: LEFT TRICEPS REPAIR (Left Arm Upper)  Patient location during evaluation: PACU Anesthesia Type: General Level of consciousness: awake and alert and oriented Pain management: pain level controlled Vital Signs Assessment: post-procedure vital signs reviewed and stable Respiratory status: spontaneous breathing, nonlabored ventilation and respiratory function stable Cardiovascular status: blood pressure returned to baseline and stable Postop Assessment: no signs of nausea or vomiting Anesthetic complications: no     Last Vitals:  Vitals:   04/30/19 1643 04/30/19 1713  BP: 140/79 (!) 150/76  Pulse: 90 89  Resp: 18 16  Temp: 36.8 C   SpO2: 94% 93%    Last Pain:  Vitals:   04/30/19 1713  TempSrc:   PainSc: 4                  Fillmore Bynum

## 2019-05-16 ENCOUNTER — Encounter: Payer: Self-pay | Admitting: Internal Medicine

## 2019-05-23 ENCOUNTER — Other Ambulatory Visit: Payer: Self-pay

## 2019-05-23 DIAGNOSIS — Z7982 Long term (current) use of aspirin: Secondary | ICD-10-CM | POA: Insufficient documentation

## 2019-05-23 DIAGNOSIS — I10 Essential (primary) hypertension: Secondary | ICD-10-CM | POA: Diagnosis not present

## 2019-05-23 DIAGNOSIS — Q613 Polycystic kidney, unspecified: Secondary | ICD-10-CM | POA: Diagnosis not present

## 2019-05-23 DIAGNOSIS — R319 Hematuria, unspecified: Secondary | ICD-10-CM | POA: Insufficient documentation

## 2019-05-23 DIAGNOSIS — Z7984 Long term (current) use of oral hypoglycemic drugs: Secondary | ICD-10-CM | POA: Diagnosis not present

## 2019-05-23 DIAGNOSIS — Z79899 Other long term (current) drug therapy: Secondary | ICD-10-CM | POA: Diagnosis not present

## 2019-05-23 LAB — CBC
HCT: 44.9 % (ref 39.0–52.0)
Hemoglobin: 15.2 g/dL (ref 13.0–17.0)
MCH: 28.7 pg (ref 26.0–34.0)
MCHC: 33.9 g/dL (ref 30.0–36.0)
MCV: 84.7 fL (ref 80.0–100.0)
Platelets: 374 10*3/uL (ref 150–400)
RBC: 5.3 MIL/uL (ref 4.22–5.81)
RDW: 12.7 % (ref 11.5–15.5)
WBC: 12.2 10*3/uL — ABNORMAL HIGH (ref 4.0–10.5)
nRBC: 0 % (ref 0.0–0.2)

## 2019-05-23 LAB — URINALYSIS, COMPLETE (UACMP) WITH MICROSCOPIC
Bacteria, UA: NONE SEEN
Bilirubin Urine: NEGATIVE
Glucose, UA: >=500 mg/dL — AB
Ketones, ur: NEGATIVE mg/dL
Nitrite: NEGATIVE
Protein, ur: >=300 mg/dL — AB
RBC / HPF: >50 RBC/hpf — ABNORMAL HIGH (ref 0–5)
Specific Gravity, Urine: 1.015 (ref 1.005–1.030)
Squamous Epithelial / HPF: NONE SEEN (ref 0–5)
WBC, UA: >50 WBC/hpf — ABNORMAL HIGH (ref 0–5)
pH: 6 (ref 5.0–8.0)

## 2019-05-23 LAB — COMPREHENSIVE METABOLIC PANEL
ALT: 17 U/L (ref 0–44)
AST: 11 U/L — ABNORMAL LOW (ref 15–41)
Albumin: 4.3 g/dL (ref 3.5–5.0)
Alkaline Phosphatase: 74 U/L (ref 38–126)
Anion gap: 9 (ref 5–15)
BUN: 28 mg/dL — ABNORMAL HIGH (ref 6–20)
CO2: 22 mmol/L (ref 22–32)
Calcium: 9.2 mg/dL (ref 8.9–10.3)
Chloride: 98 mmol/L (ref 98–111)
Creatinine, Ser: 2.48 mg/dL — ABNORMAL HIGH (ref 0.61–1.24)
GFR calc Af Amer: 34 mL/min — ABNORMAL LOW (ref >60)
GFR calc non Af Amer: 29 mL/min — ABNORMAL LOW (ref >60)
Glucose, Bld: 319 mg/dL — ABNORMAL HIGH (ref 70–99)
Potassium: 4.4 mmol/L (ref 3.5–5.1)
Sodium: 129 mmol/L — ABNORMAL LOW (ref 135–145)
Total Bilirubin: 1 mg/dL (ref 0.3–1.2)
Total Protein: 8.4 g/dL — ABNORMAL HIGH (ref 6.5–8.1)

## 2019-05-23 LAB — LACTIC ACID, PLASMA: Lactic Acid, Venous: 0.9 mmol/L (ref 0.5–1.9)

## 2019-05-23 LAB — TROPONIN I (HIGH SENSITIVITY): Troponin I (High Sensitivity): 7 ng/L (ref <18)

## 2019-05-23 NOTE — ED Triage Notes (Signed)
Pt in with co hematuria and bilat flank pain that started Saturday. Pt has hx of polycystic kidney disease, called him pmd and was started on antibiotics for the same. States has only had 2 doses, but having worsening pain and dizziness.

## 2019-05-24 ENCOUNTER — Emergency Department
Admission: EM | Admit: 2019-05-24 | Discharge: 2019-05-24 | Disposition: A | Discharge status: Home / Self Care | Payer: BC Managed Care – PPO | Attending: Emergency Medicine | Admitting: Emergency Medicine

## 2019-05-24 ENCOUNTER — Emergency Department: Payer: BC Managed Care – PPO

## 2019-05-24 ENCOUNTER — Telehealth: Payer: BC Managed Care – PPO | Admitting: Internal Medicine

## 2019-05-24 DIAGNOSIS — Q613 Polycystic kidney, unspecified: Secondary | ICD-10-CM

## 2019-05-24 DIAGNOSIS — R319 Hematuria, unspecified: Secondary | ICD-10-CM

## 2019-05-24 MED ORDER — SODIUM CHLORIDE 0.9 % IV BOLUS
500.0000 mL | Freq: Once | INTRAVENOUS | Status: AC
  Administered 2019-05-24: 500 mL via INTRAVENOUS

## 2019-05-24 MED ORDER — ONDANSETRON 4 MG PO TBDP
4.0000 mg | ORAL_TABLET | Freq: Once | ORAL | Status: AC
  Administered 2019-05-24: 4 mg via ORAL
  Filled 2019-05-24: qty 1

## 2019-05-24 MED ORDER — SODIUM CHLORIDE 0.9 % IV SOLN
1.0000 g | INTRAVENOUS | Status: AC
  Administered 2019-05-24: 1 g via INTRAVENOUS

## 2019-05-24 MED ORDER — CEPHALEXIN 500 MG PO CAPS
500.0000 mg | ORAL_CAPSULE | Freq: Four times a day (QID) | ORAL | 0 refills | Status: AC
Start: 2019-05-24 — End: 2019-06-05

## 2019-05-24 NOTE — Discharge Instructions (Signed)
As we discussed, your work-up was generally reassuring today and you can be discharged home.  However I strongly encourage you to call the office of Dr. Candiss Norse to establish care with him or one of his colleagues so that you have a local nephrologist with whom you can check in regularly.  In the meantime, please continue using your regular medications and take the full course of antibiotics I prescribed you today.  Return to the emergency department if you develop new or worsening symptoms that concern you.

## 2019-05-24 NOTE — ED Notes (Signed)
Pt transported to CT ?
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
## 2019-05-25 LAB — URINE CULTURE
Culture: NO GROWTH
Special Requests: NORMAL

## 2019-06-21 ENCOUNTER — Other Ambulatory Visit: Payer: Self-pay

## 2019-06-21 ENCOUNTER — Ambulatory Visit
Admission: EM | Admit: 2019-06-21 | Discharge: 2019-06-21 | Disposition: A | Discharge status: Home / Self Care | Payer: Medicaid Other | Attending: Emergency Medicine | Admitting: Emergency Medicine

## 2019-06-21 ENCOUNTER — Encounter: Payer: Self-pay | Admitting: Emergency Medicine

## 2019-06-21 DIAGNOSIS — S90412A Abrasion, left great toe, initial encounter: Secondary | ICD-10-CM

## 2019-06-21 MED ORDER — MUPIROCIN CALCIUM 2 % EX CREA
1.0000 | TOPICAL_CREAM | Freq: Two times a day (BID) | CUTANEOUS | 0 refills | Status: DC
Start: 2019-06-21 — End: 2020-02-07

## 2019-06-21 NOTE — Discharge Instructions (Signed)
Keep your wound clean and dry.  Wash it gently twice a day with soap and water.  Apply the antibiotic cream twice a day.    Return here if you see signs of infection, such as increased pain, redness, pus-like drainage, warmth, fever, chills, or other concerning symptoms.

## 2019-06-21 NOTE — ED Provider Notes (Signed)
Jeffery Hobbs    CSN: [id number] Arrival date & time: 06/21/19  1103      History   Chief Complaint Chief Complaint  Patient presents with   Laceration    left great toe    HPI Brodyn Depuy is a 49 y.o. male.   Patient presents with a laceration on his left great toe.  He cut it this morning on his concrete driveway.  Bleeding controlled at home with pressure.  Last tetanus 4 years ago.  He denies numbness, tingling, weakness.  Patient is diabetic.    The history is provided by the patient.    Past Medical History:  Diagnosis Date   Anxiety    CKD (chronic kidney disease)    Diabetes mellitus without complication ([place])    type 2   Essential hypertension    Polycystic kidney    PTSD (post-traumatic stress disorder)    d/t war experiences    There are no problems to display for this patient.   Past Surgical History:  Procedure Laterality Date   FOREIGN BODY REMOVAL     x 2, schrapnel removed from head and arm.    TRICEPS TENDON REPAIR Left 04/30/2019   Procedure: LEFT TRICEPS REPAIR;  Surgeon: Leim Fabry, MD;  Location: [facility] ORS;  Service: Orthopedics;  Laterality: Left;       Home Medications    Prior to Admission medications   Medication Sig Start Date End Date Taking? Authorizing Provider  amLODipine (NORVASC) 10 MG tablet Take 10 mg by mouth daily.   Yes [provider]  aspirin EC 81 MG tablet Take 81 mg by mouth daily.   Yes [provider]  glipiZIDE (GLUCOTROL) 10 MG tablet Take 10 mg by mouth daily before breakfast.    Yes [provider]  acetaminophen (TYLENOL) 500 MG tablet Take 2 tablets (1,000 mg total) by mouth every 8 (eight) hours. 04/30/19 04/29/20  Leim Fabry, MD  APPLE CIDER VINEGAR PO Take 30 mLs by mouth daily. 2  shots    [provider]  cholecalciferol (VITAMIN D3) 25 MCG (1000 UNIT) tablet Take 1,000 Units by mouth daily.    [provider]  mupirocin cream (BACTROBAN) 2 %  Apply 1 application topically 2 (two) times daily. 06/21/19   Sharion Balloon, NP  ondansetron (ZOFRAN ODT) 4 MG disintegrating tablet Take 1 tablet (4 mg total) by mouth every 8 (eight) hours as needed for nausea or vomiting. 04/30/19   Leim Fabry, MD  oxyCODONE (ROXICODONE) 5 MG immediate release tablet Take 1-2 tablets (5-10 mg total) by mouth every 4 (four) hours as needed (pain). 04/30/19 04/29/20  Leim Fabry, MD    Family History Family History  Problem Relation Age of Onset   Cancer Mother    Heart attack Father     Social History Social History   Tobacco Use   Smoking status: Never Smoker   Smokeless tobacco: Current User  Substance Use Topics   Alcohol use: Yes    Comment: social   Drug use: Not Currently     Allergies   Patient has no known allergies.   Review of Systems Review of Systems  Constitutional: Negative for chills and fever.  HENT: Negative for ear pain and sore throat.   Eyes: Negative for pain and visual disturbance.  Respiratory: Negative for cough and shortness of breath.   Cardiovascular: Negative for chest pain and palpitations.  Gastrointestinal: Negative for abdominal pain and vomiting.  Genitourinary: Negative for  dysuria and hematuria.  Musculoskeletal: Negative for arthralgias and back pain.  Skin: Positive for wound. Negative for color change and rash.  Neurological: Negative for seizures, syncope, weakness and numbness.  All other systems reviewed and are negative.    Physical Exam Triage Vital Signs ED Triage Vitals  Enc Vitals Group     BP      Pulse      Resp      Temp      Temp src      SpO2      Weight      Height      Head Circumference      Peak Flow      Pain Score      Pain Loc      Pain Edu?      Excl. in [place]?    No data found.  Updated Vital Signs BP (!) 143/87 (BP Location: Right Arm)    Pulse 80    Temp 98.6 F (37 C) (Oral)    Resp 18    Ht 5\' 9"  (1.753 m)    Wt 208 lb 15.9 oz (94.8 kg)    SpO2 97%     BMI 30.86 kg/m   Visual Acuity Right Eye Distance:   Left Eye Distance:   Bilateral Distance:    Right Eye Near:   Left Eye Near:    Bilateral Near:     Physical Exam Vitals and nursing note reviewed.  Constitutional:      General: He is not in acute distress.    Appearance: He is well-developed. He is not ill-appearing.  HENT:     Head: Normocephalic and atraumatic.     Mouth/Throat:     Mouth: Mucous membranes are moist.  Eyes:     Conjunctiva/sclera: Conjunctivae normal.  Cardiovascular:     Rate and Rhythm: Normal rate and regular rhythm.     Heart sounds: No murmur.  Pulmonary:     Effort: Pulmonary effort is normal. No respiratory distress.     Breath sounds: Normal breath sounds.  Abdominal:     Palpations: Abdomen is soft.     Tenderness: There is no abdominal tenderness. There is no guarding or rebound.  Musculoskeletal:     Cervical back: Neck supple.  Skin:    General: Skin is warm and dry.     Capillary Refill: Capillary refill takes less than 2 seconds.     Findings: Lesion present.     Comments: Superficial 0.5 cm abrasion on left great toe; no active bleeding.      Neurological:     General: No focal deficit present.     Mental Status: He is alert and oriented to person, place, and time.     Sensory: No sensory deficit.     Motor: No weakness.     Gait: Gait normal.  Psychiatric:        Mood and Affect: Mood normal.        Behavior: Behavior normal.      UC Treatments / Results  Labs (all labs ordered are listed, but only abnormal results are displayed) Labs Reviewed - No data to display  EKG   Radiology No results found.  Procedures Procedures (including critical care time)  Medications Ordered in UC Medications - No data to display  Initial Impression / Assessment and Plan / UC Course  I have reviewed the triage vital signs and the nursing notes.  Pertinent labs & imaging results  that were available during my care of the  patient were reviewed by me and considered in my medical decision making (see chart for details).   Abrasion of left great toe.  Treating with Bactroban ointment.  Tetanus up-to-date.  Wound care instructions and signs of infection discussed with patient.  Instructed him to return here if he notes signs of infection.  Patient agrees to plan of care.   Final Clinical Impressions(s) / UC Diagnoses   Final diagnoses:  Abrasion of left great toe, initial encounter     Discharge Instructions     Keep your wound clean and dry.  Wash it gently twice a day with soap and water.  Apply the antibiotic cream twice a day.    Return here if you see signs of infection, such as increased pain, redness, pus-like drainage, warmth, fever, chills, or other concerning symptoms.        ED Prescriptions    Medication Sig Dispense Auth. Provider   mupirocin cream (BACTROBAN) 2 % Apply 1 application topically 2 (two) times daily. 15 g Sharion Balloon, NP     PDMP not reviewed this encounter.   Sharion Balloon, NP 06/21/19 [phone]

## 2019-06-21 NOTE — ED Triage Notes (Signed)
Pt has a laceration on his left great toe. Occurred this morning. He was stepped wrong in his drive way and his foot slid down the side of the concrete. He states his last tetanus was about 4 years ago.

## 2019-11-17 ENCOUNTER — Other Ambulatory Visit: Payer: Self-pay

## 2019-11-17 ENCOUNTER — Emergency Department
Admission: EM | Admit: 2019-11-17 | Discharge: 2019-11-17 | Disposition: A | Discharge status: Home / Self Care | Payer: BC Managed Care – PPO | Attending: Emergency Medicine | Admitting: Emergency Medicine

## 2019-11-17 ENCOUNTER — Encounter: Payer: Self-pay | Admitting: Emergency Medicine

## 2019-11-17 DIAGNOSIS — Z7984 Long term (current) use of oral hypoglycemic drugs: Secondary | ICD-10-CM | POA: Insufficient documentation

## 2019-11-17 DIAGNOSIS — R002 Palpitations: Secondary | ICD-10-CM | POA: Insufficient documentation

## 2019-11-17 DIAGNOSIS — Z79899 Other long term (current) drug therapy: Secondary | ICD-10-CM | POA: Insufficient documentation

## 2019-11-17 DIAGNOSIS — Z7982 Long term (current) use of aspirin: Secondary | ICD-10-CM | POA: Diagnosis not present

## 2019-11-17 DIAGNOSIS — Z20822 Contact with and (suspected) exposure to covid-19: Secondary | ICD-10-CM | POA: Diagnosis not present

## 2019-11-17 DIAGNOSIS — R42 Dizziness and giddiness: Secondary | ICD-10-CM | POA: Insufficient documentation

## 2019-11-17 DIAGNOSIS — I129 Hypertensive chronic kidney disease with stage 1 through stage 4 chronic kidney disease, or unspecified chronic kidney disease: Secondary | ICD-10-CM | POA: Insufficient documentation

## 2019-11-17 DIAGNOSIS — R61 Generalized hyperhidrosis: Secondary | ICD-10-CM | POA: Diagnosis not present

## 2019-11-17 DIAGNOSIS — E1122 Type 2 diabetes mellitus with diabetic chronic kidney disease: Secondary | ICD-10-CM | POA: Insufficient documentation

## 2019-11-17 DIAGNOSIS — N189 Chronic kidney disease, unspecified: Secondary | ICD-10-CM | POA: Insufficient documentation

## 2019-11-17 DIAGNOSIS — R110 Nausea: Secondary | ICD-10-CM | POA: Insufficient documentation

## 2019-11-17 LAB — URINALYSIS, COMPLETE (UACMP) WITH MICROSCOPIC
Bacteria, UA: NONE SEEN
Bilirubin Urine: NEGATIVE
Glucose, UA: 150 mg/dL — AB
Ketones, ur: NEGATIVE mg/dL
Leukocytes,Ua: NEGATIVE
Nitrite: NEGATIVE
Protein, ur: 30 mg/dL — AB
Specific Gravity, Urine: 1.01 (ref 1.005–1.030)
pH: 6 (ref 5.0–8.0)

## 2019-11-17 LAB — HEPATIC FUNCTION PANEL
ALT: 29 U/L (ref 0–44)
AST: 21 U/L (ref 15–41)
Albumin: 4.1 g/dL (ref 3.5–5.0)
Alkaline Phosphatase: 56 U/L (ref 38–126)
Bilirubin, Direct: <0.1 mg/dL (ref 0.0–0.2)
Total Bilirubin: 0.6 mg/dL (ref 0.3–1.2)
Total Protein: 7.7 g/dL (ref 6.5–8.1)

## 2019-11-17 LAB — BASIC METABOLIC PANEL
Anion gap: 10 (ref 5–15)
BUN: 30 mg/dL — ABNORMAL HIGH (ref 6–20)
CO2: 23 mmol/L (ref 22–32)
Calcium: 9.5 mg/dL (ref 8.9–10.3)
Chloride: 104 mmol/L (ref 98–111)
Creatinine, Ser: 2.59 mg/dL — ABNORMAL HIGH (ref 0.61–1.24)
GFR, Estimated: 29 mL/min — ABNORMAL LOW (ref >60)
Glucose, Bld: 205 mg/dL — ABNORMAL HIGH (ref 70–99)
Potassium: 4.4 mmol/L (ref 3.5–5.1)
Sodium: 137 mmol/L (ref 135–145)

## 2019-11-17 LAB — CBC
HCT: 42.3 % (ref 39.0–52.0)
Hemoglobin: 14.1 g/dL (ref 13.0–17.0)
MCH: 29 pg (ref 26.0–34.0)
MCHC: 33.3 g/dL (ref 30.0–36.0)
MCV: 87 fL (ref 80.0–100.0)
Platelets: 301 10*3/uL (ref 150–400)
RBC: 4.86 MIL/uL (ref 4.22–5.81)
RDW: 13.6 % (ref 11.5–15.5)
WBC: 6 10*3/uL (ref 4.0–10.5)
nRBC: 0 % (ref 0.0–0.2)

## 2019-11-17 LAB — TROPONIN I (HIGH SENSITIVITY): Troponin I (High Sensitivity): 17 ng/L (ref <18)

## 2019-11-17 LAB — RESPIRATORY PANEL BY RT PCR (FLU A&B, COVID)
Influenza A by PCR: NEGATIVE
Influenza B by PCR: NEGATIVE
SARS Coronavirus 2 by RT PCR: NEGATIVE

## 2019-11-17 LAB — CBG MONITORING, ED: Glucose-Capillary: 201 mg/dL — ABNORMAL HIGH (ref 70–99)

## 2019-11-17 LAB — LIPASE, BLOOD: Lipase: 63 U/L — ABNORMAL HIGH (ref 11–51)

## 2019-11-17 MED ORDER — SODIUM CHLORIDE 0.9 % IV BOLUS
1000.0000 mL | Freq: Once | INTRAVENOUS | Status: AC
  Administered 2019-11-17: 1000 mL via INTRAVENOUS

## 2019-11-17 NOTE — ED Triage Notes (Signed)
Pt via pov from home with dizziness x 2 days. Pt states he was on his way to work this morning and felt like he was going to pass out. Pt states he is diabetic, has htn, and diabetes. Pt diaphoretic, alert, oriented.

## 2019-11-17 NOTE — ED Provider Notes (Signed)
[facility] Emergency Department Provider Note  Time seen: 10:36 AM  I have reviewed the triage vital signs and the nursing notes.   HISTORY  Chief Complaint Dizziness   HPI Jeffery Hobbs is a 49 y.o. male with a past medical history anxiety, CKD, diabetes, presents to the emergency department for dizziness and nausea.  According to the patient over the past 2 days or so he has not been feeling very well, states at times he is getting dizzy and feeling heart palpitations states he has been nauseated with intermittent diaphoresis but denies any chest pain.  Denies any headache.  Denies any focal weakness or numbness confusion or slurred speech.  Patient is fully vaccinated but states he had a recent Covid exposure approximately 3 days ago.  Denies any cough or shortness of breath.   Past Medical History:  Diagnosis Date  . Anxiety   . CKD (chronic kidney disease)   . Diabetes mellitus without complication ([place])    type 2  . Essential hypertension   . Polycystic kidney   . PTSD (post-traumatic stress disorder)    d/t war experiences    There are no problems to display for this patient.   Past Surgical History:  Procedure Laterality Date  . FOREIGN BODY REMOVAL     x 2, schrapnel removed from head and arm.   . TRICEPS TENDON REPAIR Left 04/30/2019   Procedure: LEFT TRICEPS REPAIR;  Surgeon: Leim Fabry, MD;  Location: [facility] ORS;  Service: Orthopedics;  Laterality: Left;    Prior to Admission medications   Medication Sig Start Date End Date Taking? Authorizing Provider  acetaminophen (TYLENOL) 500 MG tablet Take 2 tablets (1,000 mg total) by mouth every 8 (eight) hours. 04/30/19 04/29/20  Leim Fabry, MD  amLODipine (NORVASC) 10 MG tablet Take 10 mg by mouth daily.    [provider]  APPLE CIDER VINEGAR PO Take 30 mLs by mouth daily. 2  shots    [provider]  aspirin EC 81 MG tablet Take 81 mg by mouth daily.    [provider]   cholecalciferol (VITAMIN D3) 25 MCG (1000 UNIT) tablet Take 1,000 Units by mouth daily.    [provider]  glipiZIDE (GLUCOTROL) 10 MG tablet Take 10 mg by mouth daily before breakfast.     [provider]  mupirocin cream (BACTROBAN) 2 % Apply 1 application topically 2 (two) times daily. 06/21/19   Sharion Balloon, NP  ondansetron (ZOFRAN ODT) 4 MG disintegrating tablet Take 1 tablet (4 mg total) by mouth every 8 (eight) hours as needed for nausea or vomiting. 04/30/19   Leim Fabry, MD  oxyCODONE (ROXICODONE) 5 MG immediate release tablet Take 1-2 tablets (5-10 mg total) by mouth every 4 (four) hours as needed (pain). 04/30/19 04/29/20  Leim Fabry, MD    No Known Allergies  Family History  Problem Relation Age of Onset  . Cancer Mother   . Heart attack Father     Social History Social History   Tobacco Use  . Smoking status: Never Smoker  . Smokeless tobacco: Current User  Vaping Use  . Vaping Use: Never used  Substance Use Topics  . Alcohol use: Yes    Comment: social  . Drug use: Not Currently    Review of Systems Constitutional: Negative for fever.  Positive for dizziness.  Positive for weakness. Cardiovascular: Negative for chest pain.  Occasional palpitations. Respiratory: Negative for shortness of breath. Gastrointestinal: Negative for abdominal pain,  vomiting.  Occasional loose stool.  Positive for nausea. Genitourinary: Negative for urinary compaints Musculoskeletal: Negative for musculoskeletal complaints Skin: Occasional cold sweats/diaphoresis. Neurological: Negative for headache All other ROS negative  ____________________________________________   PHYSICAL EXAM:  VITAL SIGNS: ED Triage Vitals  Enc Vitals Group     BP 11/17/19 0808 (!) 179/99     Pulse Rate 11/17/19 0808 (!) 103     Resp 11/17/19 0808 18     Temp 11/17/19 0808 98 F (36.7 C)     Temp Source 11/17/19 0808 Oral     SpO2 11/17/19 0808 99 %     Weight 11/17/19 0809 215  lb (97.5 kg)     Height 11/17/19 0809 5\' 9"  (1.753 m)     Head Circumference --      Peak Flow --      Pain Score 11/17/19 0808 0     Pain Loc --      Pain Edu? --      Excl. in [place]? --     Constitutional: Alert and oriented. Well appearing and in no distress.  Mildly anxious. Eyes: Normal exam ENT      Head: Normocephalic and atraumatic.      Mouth/Throat: Mucous membranes are moist. Cardiovascular: Normal rate, regular rhythm Respiratory: Normal respiratory effort without tachypnea nor retractions. Breath sounds are clear Gastrointestinal: Soft and nontender. No distention.   Musculoskeletal: Nontender with normal range of motion in all extremities.  Neurologic:  Normal speech and language. No gross focal neurologic deficits Skin:  Skin is warm, dry and intact.  Psychiatric: Slightly anxious.  ____________________________________________    EKG  EKG viewed and interpreted by myself shows normal sinus rhythm at 100 bpm with a narrow QRS, normal axis, normal intervals, nonspecific ST changes but no ST elevation.  ____________________________________________   INITIAL IMPRESSION / ASSESSMENT AND PLAN / ED COURSE  Pertinent labs & imaging results that were available during my care of the patient were reviewed by me and considered in my medical decision making (see chart for details).   Patient presents emergency department for intermittent dizziness palpitations nausea.  Overall patient appears well on exam.  Patient's lab work is largely at baseline including chronic kidney disease.  We will IV hydrate.  I have added on cardiac enzymes as precaution.  EKG is overall reassuring.  We will also obtain a Covid swab as a precaution.  Patient agreeable to plan of care.  Patient's work-up including Covid test cardiac enzymes and urinalysis have resulted normal.  Patient feels better after IV fluids.  We will discharge patient home with cardiology follow-up to discuss possible Holter  monitor.  Patient agreeable to plan of care.  Discussed return precautions.  Jeffery Hobbs was evaluated in Emergency Department on 11/17/2019 for the symptoms described in the history of present illness. He was evaluated in the context of the global COVID-19 pandemic, which necessitated consideration that the patient might be at risk for infection with the SARS-CoV-2 virus that causes COVID-19. Institutional protocols and algorithms that pertain to the evaluation of patients at risk for COVID-19 are in a state of rapid change based on information released by regulatory bodies including the CDC and federal and state organizations. These policies and algorithms were followed during the patient's care in the ED.  ____________________________________________   FINAL CLINICAL IMPRESSION(S) / ED DIAGNOSES  Dizziness   Harvest Dark, MD 11/17/19 1359

## 2019-11-17 NOTE — ED Notes (Signed)
Pt to ED stating felt like was going to "pass out" this morning while driving to work. Felt nauseated and was diaphoretic. Legs were shaking. All of this occurred around 0730 today. Denies LOC. Currently alert and oriented times 4. Not diaphoretic. Hx HTN, polycystic kidney disease, DM (not sure which type).

## 2019-11-17 NOTE — Discharge Instructions (Addendum)
Please drink plenty fluids and obtain plenty of rest.  Please follow-up with cardiology by calling the number provided to arrange a follow-up appointment for further evaluation and possible Holter monitor.  Return to the emergency department for any worsening symptoms, fever, or any other symptom personally concerning to yourself.

## 2019-11-17 NOTE — ED Triage Notes (Signed)
cbg 201

## 2019-11-17 NOTE — ED Triage Notes (Signed)
Pt employer updated per pt request

## 2020-01-08 ENCOUNTER — Emergency Department
Admission: EM | Admit: 2020-01-08 | Discharge: 2020-01-08 | Disposition: A | Discharge status: Home / Self Care | Payer: BC Managed Care – PPO | Attending: Emergency Medicine | Admitting: Emergency Medicine

## 2020-01-08 ENCOUNTER — Other Ambulatory Visit: Payer: Self-pay

## 2020-01-08 ENCOUNTER — Emergency Department: Payer: BC Managed Care – PPO

## 2020-01-08 DIAGNOSIS — Z20822 Contact with and (suspected) exposure to covid-19: Secondary | ICD-10-CM | POA: Diagnosis not present

## 2020-01-08 DIAGNOSIS — R42 Dizziness and giddiness: Secondary | ICD-10-CM | POA: Diagnosis not present

## 2020-01-08 DIAGNOSIS — Z79899 Other long term (current) drug therapy: Secondary | ICD-10-CM | POA: Insufficient documentation

## 2020-01-08 DIAGNOSIS — E1165 Type 2 diabetes mellitus with hyperglycemia: Secondary | ICD-10-CM | POA: Insufficient documentation

## 2020-01-08 DIAGNOSIS — Z7984 Long term (current) use of oral hypoglycemic drugs: Secondary | ICD-10-CM | POA: Diagnosis not present

## 2020-01-08 DIAGNOSIS — I129 Hypertensive chronic kidney disease with stage 1 through stage 4 chronic kidney disease, or unspecified chronic kidney disease: Secondary | ICD-10-CM | POA: Insufficient documentation

## 2020-01-08 DIAGNOSIS — R739 Hyperglycemia, unspecified: Secondary | ICD-10-CM

## 2020-01-08 DIAGNOSIS — N189 Chronic kidney disease, unspecified: Secondary | ICD-10-CM | POA: Diagnosis not present

## 2020-01-08 DIAGNOSIS — Z7982 Long term (current) use of aspirin: Secondary | ICD-10-CM | POA: Diagnosis not present

## 2020-01-08 LAB — URINALYSIS, COMPLETE (UACMP) WITH MICROSCOPIC
Bacteria, UA: NONE SEEN
Bilirubin Urine: NEGATIVE
Glucose, UA: >=500 mg/dL — AB
Ketones, ur: NEGATIVE mg/dL
Leukocytes,Ua: NEGATIVE
Nitrite: NEGATIVE
Protein, ur: 30 mg/dL — AB
Specific Gravity, Urine: 1.017 (ref 1.005–1.030)
Squamous Epithelial / HPF: NONE SEEN (ref 0–5)
pH: 5 (ref 5.0–8.0)

## 2020-01-08 LAB — CBG MONITORING, ED
Glucose-Capillary: 519 mg/dL (ref 70–99)
Glucose-Capillary: 422 mg/dL — ABNORMAL HIGH (ref 70–99)
Glucose-Capillary: 186 mg/dL — ABNORMAL HIGH (ref 70–99)
Glucose-Capillary: 190 mg/dL — ABNORMAL HIGH (ref 70–99)

## 2020-01-08 LAB — RESP PANEL BY RT-PCR (FLU A&B, COVID) ARPGX2
Influenza A by PCR: NEGATIVE
Influenza B by PCR: NEGATIVE
SARS Coronavirus 2 by RT PCR: NEGATIVE

## 2020-01-08 LAB — CBC
HCT: 43.5 % (ref 39.0–52.0)
Hemoglobin: 14.7 g/dL (ref 13.0–17.0)
MCH: 28.7 pg (ref 26.0–34.0)
MCHC: 33.8 g/dL (ref 30.0–36.0)
MCV: 85 fL (ref 80.0–100.0)
Platelets: 305 10*3/uL (ref 150–400)
RBC: 5.12 MIL/uL (ref 4.22–5.81)
RDW: 12.4 % (ref 11.5–15.5)
WBC: 5.9 10*3/uL (ref 4.0–10.5)
nRBC: 0 % (ref 0.0–0.2)

## 2020-01-08 LAB — BASIC METABOLIC PANEL
Anion gap: 11 (ref 5–15)
BUN: 39 mg/dL — ABNORMAL HIGH (ref 6–20)
CO2: 17 mmol/L — ABNORMAL LOW (ref 22–32)
Calcium: 9.2 mg/dL (ref 8.9–10.3)
Chloride: 97 mmol/L — ABNORMAL LOW (ref 98–111)
Creatinine, Ser: 3.01 mg/dL — ABNORMAL HIGH (ref 0.61–1.24)
GFR, Estimated: 24 mL/min — ABNORMAL LOW (ref >60)
Glucose, Bld: 552 mg/dL (ref 70–99)
Potassium: 5.1 mmol/L (ref 3.5–5.1)
Sodium: 125 mmol/L — ABNORMAL LOW (ref 135–145)

## 2020-01-08 MED ORDER — SODIUM CHLORIDE 0.9 % IV BOLUS
1000.0000 mL | Freq: Once | INTRAVENOUS | Status: AC
  Administered 2020-01-08: 13:00:00 1000 mL via INTRAVENOUS

## 2020-01-08 MED ORDER — SODIUM CHLORIDE 0.9 % IV SOLN: Freq: Once | INTRAVENOUS | Status: AC

## 2020-01-08 MED ORDER — INSULIN ASPART 100 UNIT/ML ~~LOC~~ SOLN
10.0000 [IU] | Freq: Once | SUBCUTANEOUS | Status: AC
  Administered 2020-01-08: 13:00:00 10 [IU] via INTRAVENOUS
  Filled 2020-01-08: qty 1

## 2020-01-08 NOTE — ED Triage Notes (Signed)
Pt comes via POV from home with c/o hyperglycemia. Pt states he checked sugar at home and it read high. Pt states he takes 10mg  lipizide twice daily and 18 units lantus. Pt states he took his 10mg  of lipizide this am.  Current [id number]. Pt states he has his sugar in 400 one time recently and they increased his dosage back in June of this year.

## 2020-01-08 NOTE — ED Notes (Signed)
Provider made aware of BS. Provider also made aware that pt had not eaten yet today. Pt provided with diet tray at this time per VO.

## 2020-01-08 NOTE — ED Provider Notes (Signed)
[facility] Emergency Department Provider Note   ____________________________________________   Event Date/Time   First MD Initiated Contact with Patient 01/08/20 1235     (approximate)  I have reviewed the triage vital signs and the nursing notes.   HISTORY  Chief Complaint Hyperglycemia    HPI Jeffery Hobbs is a 49 y.o. male with past medical history of hypertension, diabetes, polycystic kidney disease, and CKD who presents to the ED complaining of hyperglycemia. Patient reports that he has started feeling dizzy and lightheaded for the past couple of days. He again felt dizzy and lightheaded getting out of the shower before work today, checked his blood sugar and it read "high." He states he has been compliant with his usual dose of twice daily glipizide and nightly Lantus. He took his Lantus last night and his morning dose of glipizide earlier today. He has never had issues with his blood sugar running this high, but does admit to having a fair amount of candy over the past couple of days due to the holidays. He reports a productive cough but denies any pain in his chest or difficulty breathing. He has not had any abdominal pain, nausea, vomiting, changes in bowel movements, dysuria, or hematuria. He has not had any recent changes in his diabetic regimen.        Past Medical History:  Diagnosis Date  . Anxiety   . CKD (chronic kidney disease)   . Diabetes mellitus without complication ([place])    type 2  . Essential hypertension   . Polycystic kidney   . PTSD (post-traumatic stress disorder)    d/t war experiences    There are no problems to display for this patient.   Past Surgical History:  Procedure Laterality Date  . FOREIGN BODY REMOVAL     x 2, schrapnel removed from head and arm.   . TRICEPS TENDON REPAIR Left 04/30/2019   Procedure: LEFT TRICEPS REPAIR;  Surgeon: Leim Fabry, MD;  Location: [facility] ORS;  Service: Orthopedics;  Laterality: Left;     Prior to Admission medications   Medication Sig Start Date End Date Taking? Authorizing Provider  acetaminophen (TYLENOL) 500 MG tablet Take 2 tablets (1,000 mg total) by mouth every 8 (eight) hours. 04/30/19 04/29/20  Leim Fabry, MD  amLODipine (NORVASC) 10 MG tablet Take 10 mg by mouth daily.    [provider]  APPLE CIDER VINEGAR PO Take 30 mLs by mouth daily. 2  shots    [provider]  aspirin EC 81 MG tablet Take 81 mg by mouth daily.    [provider]  cholecalciferol (VITAMIN D3) 25 MCG (1000 UNIT) tablet Take 1,000 Units by mouth daily.    [provider]  glipiZIDE (GLUCOTROL) 10 MG tablet Take 10 mg by mouth daily before breakfast.     [provider]  mupirocin cream (BACTROBAN) 2 % Apply 1 application topically 2 (two) times daily. 06/21/19   Sharion Balloon, NP  ondansetron (ZOFRAN ODT) 4 MG disintegrating tablet Take 1 tablet (4 mg total) by mouth every 8 (eight) hours as needed for nausea or vomiting. 04/30/19   Leim Fabry, MD  oxyCODONE (ROXICODONE) 5 MG immediate release tablet Take 1-2 tablets (5-10 mg total) by mouth every 4 (four) hours as needed (pain). 04/30/19 04/29/20  Leim Fabry, MD    Allergies Patient has no known allergies.  Family History  Problem Relation Age of Onset  . Cancer Mother   . Heart attack Father  Social History Social History   Tobacco Use  . Smoking status: Never Smoker  . Smokeless tobacco: Current User  Vaping Use  . Vaping Use: Never used  Substance Use Topics  . Alcohol use: Yes    Comment: social  . Drug use: Not Currently    Review of Systems  Constitutional: No fever/chills. Positive for lightheadedness and dizziness. Eyes: No visual changes. ENT: No sore throat. Cardiovascular: Denies chest pain. Respiratory: Denies shortness of breath. Positive for cough. Gastrointestinal: No abdominal pain.  No nausea, no vomiting.  No diarrhea.  No constipation. Genitourinary:  Negative for dysuria. Musculoskeletal: Negative for back pain. Skin: Negative for rash. Neurological: Negative for headaches, focal weakness or numbness.  ____________________________________________   PHYSICAL EXAM:  VITAL SIGNS: ED Triage Vitals  Enc Vitals Group     BP 01/08/20 1010 (!) 158/88     Pulse Rate 01/08/20 1010 (!) 110     Resp 01/08/20 1010 19     Temp 01/08/20 1010 98.4 F (36.9 C)     Temp src --      SpO2 01/08/20 1010 96 %     Weight --      Height --      Head Circumference --      Peak Flow --      Pain Score 01/08/20 1012 0     Pain Loc --      Pain Edu? --      Excl. in [place]? --     Constitutional: Alert and oriented. Eyes: Conjunctivae are normal. Head: Atraumatic. Nose: No congestion/rhinnorhea. Mouth/Throat: Mucous membranes are moist. Neck: Normal ROM Cardiovascular: Normal rate, regular rhythm. Grossly normal heart sounds. 2+ radial pulses bilaterally. Respiratory: Normal respiratory effort.  No retractions. Lungs CTAB. Gastrointestinal: Soft and nontender. No distention. Genitourinary: deferred Musculoskeletal: No lower extremity tenderness nor edema. Neurologic:  Normal speech and language. No gross focal neurologic deficits are appreciated. Skin:  Skin is warm, dry and intact. No rash noted. Psychiatric: Mood and affect are normal. Speech and behavior are normal.  ____________________________________________   LABS (all labs ordered are listed, but only abnormal results are displayed)  Labs Reviewed  BASIC METABOLIC PANEL - Abnormal; Notable for the following components:      Result Value   Sodium 125 (*)    Chloride 97 (*)    CO2 17 (*)    Glucose, Bld 552 (*)    BUN 39 (*)    Creatinine, Ser 3.01 (*)    GFR, Estimated 24 (*)    All other components within normal limits  URINALYSIS, COMPLETE (UACMP) WITH MICROSCOPIC - Abnormal; Notable for the following components:   Color, Urine STRAW (*)    APPearance CLEAR (*)     Glucose, UA >=500 (*)    Hgb urine dipstick SMALL (*)    Protein, ur 30 (*)    All other components within normal limits  BLOOD GAS, VENOUS - Abnormal; Notable for the following components:   Acid-base deficit 3.8 (*)    All other components within normal limits  CBG MONITORING, ED - Abnormal; Notable for the following components:   Glucose-Capillary 519 (*)    All other components within normal limits  CBG MONITORING, ED - Abnormal; Notable for the following components:   Glucose-Capillary 422 (*)    All other components within normal limits  CBG MONITORING, ED - Abnormal; Notable for the following components:   Glucose-Capillary 186 (*)    All other components within normal limits  CBG MONITORING, ED - Abnormal; Notable for the following components:   Glucose-Capillary 190 (*)    All other components within normal limits  RESP PANEL BY RT-PCR (FLU A&B, COVID) ARPGX2  CBC    PROCEDURES  Procedure(s) performed (including Critical Care):  Procedures   ____________________________________________   INITIAL IMPRESSION / ASSESSMENT AND PLAN / ED COURSE       49 year old male with past medical history of hypertension, diabetes, polycystic kidney disease, and CKD who presents to the ED for lightheadedness and dizziness for the past couple of days after noticing his blood glucose was high earlier today. Labs thus far remarkable for hyperglycemia and mild acidosis, but no anion gap to suggest DKA. VBG is also reassuring. Labs do show hyponatremia, however this is within normal limits when corrected for glucose levels. We will hydrate patient with IV fluids and he was also given 10 units of insulin. UA shows no evidence of infectious process, we will also check chest x-ray and COVID-19 testing. If further testing is unremarkable, I suspect his hyperglycemia is due to dietary noncompliance.  Patient feels better following IV fluid bolus and dose of insulin.  Blood sugar is much improved,  chest x-ray reviewed by me and shows no infiltrate, edema, or effusion.  Testing for COVID-19 and influenza is negative.  I suspect patient's hyperglycemia is due to dietary indiscretion and he is appropriate for discharge home with PCP follow-up.  Patient agrees with plan.      ____________________________________________   FINAL CLINICAL IMPRESSION(S) / ED DIAGNOSES  Final diagnoses:  Hyperglycemia  Dizziness     ED Discharge Orders    None       Note:  This document was prepared using Dragon voice recognition software and may include unintentional dictation errors.   Blake Divine, MD 01/08/20 1520

## 2020-01-10 ENCOUNTER — Encounter: Payer: Self-pay | Admitting: Emergency Medicine

## 2020-01-10 ENCOUNTER — Other Ambulatory Visit: Payer: Self-pay

## 2020-01-10 ENCOUNTER — Emergency Department
Admission: EM | Admit: 2020-01-10 | Discharge: 2020-01-10 | Disposition: A | Discharge status: Home / Self Care | Payer: BC Managed Care – PPO | Attending: Emergency Medicine | Admitting: Emergency Medicine

## 2020-01-10 DIAGNOSIS — H538 Other visual disturbances: Secondary | ICD-10-CM

## 2020-01-10 DIAGNOSIS — R42 Dizziness and giddiness: Secondary | ICD-10-CM

## 2020-01-10 DIAGNOSIS — N189 Chronic kidney disease, unspecified: Secondary | ICD-10-CM | POA: Diagnosis not present

## 2020-01-10 DIAGNOSIS — I129 Hypertensive chronic kidney disease with stage 1 through stage 4 chronic kidney disease, or unspecified chronic kidney disease: Secondary | ICD-10-CM | POA: Insufficient documentation

## 2020-01-10 DIAGNOSIS — Z79899 Other long term (current) drug therapy: Secondary | ICD-10-CM | POA: Insufficient documentation

## 2020-01-10 DIAGNOSIS — Z7982 Long term (current) use of aspirin: Secondary | ICD-10-CM | POA: Diagnosis not present

## 2020-01-10 DIAGNOSIS — R739 Hyperglycemia, unspecified: Secondary | ICD-10-CM

## 2020-01-10 DIAGNOSIS — E860 Dehydration: Secondary | ICD-10-CM | POA: Diagnosis not present

## 2020-01-10 DIAGNOSIS — Z7984 Long term (current) use of oral hypoglycemic drugs: Secondary | ICD-10-CM | POA: Insufficient documentation

## 2020-01-10 DIAGNOSIS — E1165 Type 2 diabetes mellitus with hyperglycemia: Secondary | ICD-10-CM | POA: Insufficient documentation

## 2020-01-10 DIAGNOSIS — E1122 Type 2 diabetes mellitus with diabetic chronic kidney disease: Secondary | ICD-10-CM | POA: Insufficient documentation

## 2020-01-10 LAB — COMPREHENSIVE METABOLIC PANEL
ALT: 19 U/L (ref 0–44)
AST: 12 U/L — ABNORMAL LOW (ref 15–41)
Albumin: 4.2 g/dL (ref 3.5–5.0)
Alkaline Phosphatase: 72 U/L (ref 38–126)
Anion gap: 10 (ref 5–15)
BUN: 40 mg/dL — ABNORMAL HIGH (ref 6–20)
CO2: 22 mmol/L (ref 22–32)
Calcium: 9.6 mg/dL (ref 8.9–10.3)
Chloride: 102 mmol/L (ref 98–111)
Creatinine, Ser: 2.79 mg/dL — ABNORMAL HIGH (ref 0.61–1.24)
GFR, Estimated: 27 mL/min — ABNORMAL LOW (ref >60)
Glucose, Bld: 381 mg/dL — ABNORMAL HIGH (ref 70–99)
Potassium: 5 mmol/L (ref 3.5–5.1)
Sodium: 134 mmol/L — ABNORMAL LOW (ref 135–145)
Total Bilirubin: 0.6 mg/dL (ref 0.3–1.2)
Total Protein: 7.7 g/dL (ref 6.5–8.1)

## 2020-01-10 LAB — URINALYSIS, COMPLETE (UACMP) WITH MICROSCOPIC
Bacteria, UA: NONE SEEN
Bilirubin Urine: NEGATIVE
Glucose, UA: >=500 mg/dL — AB
Ketones, ur: NEGATIVE mg/dL
Leukocytes,Ua: NEGATIVE
Nitrite: NEGATIVE
Protein, ur: 30 mg/dL — AB
Specific Gravity, Urine: 1.014 (ref 1.005–1.030)
Squamous Epithelial / HPF: NONE SEEN (ref 0–5)
pH: 5 (ref 5.0–8.0)

## 2020-01-10 LAB — BLOOD GAS, VENOUS
Acid-base deficit: 3 mmol/L — ABNORMAL HIGH (ref 0.0–2.0)
Bicarbonate: 22.7 mmol/L (ref 20.0–28.0)
O2 Saturation: 83.3 %
Patient temperature: 37
pCO2, Ven: 42 mmHg — ABNORMAL LOW (ref 44.0–60.0)
pH, Ven: 7.34 (ref 7.250–7.430)
pO2, Ven: 51 mmHg — ABNORMAL HIGH (ref 32.0–45.0)

## 2020-01-10 LAB — CBC WITH DIFFERENTIAL/PLATELET
Abs Immature Granulocytes: 0.02 10*3/uL (ref 0.00–0.07)
Basophils Absolute: 0 10*3/uL (ref 0.0–0.1)
Basophils Relative: 1 %
Eosinophils Absolute: 0.1 10*3/uL (ref 0.0–0.5)
Eosinophils Relative: 2 %
HCT: 42.3 % (ref 39.0–52.0)
Hemoglobin: 14 g/dL (ref 13.0–17.0)
Immature Granulocytes: 0 %
Lymphocytes Relative: 29 %
Lymphs Abs: 1.9 10*3/uL (ref 0.7–4.0)
MCH: 28.7 pg (ref 26.0–34.0)
MCHC: 33.1 g/dL (ref 30.0–36.0)
MCV: 86.9 fL (ref 80.0–100.0)
Monocytes Absolute: 0.6 10*3/uL (ref 0.1–1.0)
Monocytes Relative: 9 %
Neutro Abs: 3.9 10*3/uL (ref 1.7–7.7)
Neutrophils Relative %: 59 %
Platelets: 332 10*3/uL (ref 150–400)
RBC: 4.87 MIL/uL (ref 4.22–5.81)
RDW: 12.4 % (ref 11.5–15.5)
WBC: 6.5 10*3/uL (ref 4.0–10.5)
nRBC: 0 % (ref 0.0–0.2)

## 2020-01-10 LAB — BETA-HYDROXYBUTYRIC ACID: Beta-Hydroxybutyric Acid: 0.13 mmol/L (ref 0.05–0.27)

## 2020-01-10 LAB — TROPONIN I (HIGH SENSITIVITY)
Troponin I (High Sensitivity): 10 ng/L (ref <18)
Troponin I (High Sensitivity): 9 ng/L (ref <18)

## 2020-01-10 LAB — CBG MONITORING, ED: Glucose-Capillary: 369 mg/dL — ABNORMAL HIGH (ref 70–99)

## 2020-01-10 MED ORDER — SODIUM CHLORIDE 0.9 % IV BOLUS
1000.0000 mL | Freq: Once | INTRAVENOUS | Status: AC
  Administered 2020-01-10: 09:00:00 1000 mL via INTRAVENOUS

## 2020-01-10 MED ORDER — INSULIN ASPART 100 UNIT/ML ~~LOC~~ SOLN
5.0000 [IU] | Freq: Once | SUBCUTANEOUS | Status: AC
  Administered 2020-01-10: 10:00:00 5 [IU] via INTRAVENOUS
  Filled 2020-01-10: qty 1

## 2020-01-10 NOTE — ED Notes (Signed)
ED Provider at bedside. 

## 2020-01-10 NOTE — ED Notes (Signed)
Pt wheeled to lobby.

## 2020-01-10 NOTE — ED Triage Notes (Addendum)
Patient ambulatory to triage with steady gait, without difficulty or distress noted; pt reports FSBS 490 at home; st has appt today with PCP but "couldn't wait"; c/o dizziness

## 2020-01-10 NOTE — ED Provider Notes (Signed)
[facility] Emergency Department Provider Note   ____________________________________________   Event Date/Time   First MD Initiated Contact with Patient 01/10/20 [phone]     (approximate)  I have reviewed the triage vital signs and the nursing notes.   HISTORY  Chief Complaint Hyperglycemia    HPI Jeffery Hobbs is a 49 y.o. male with a stated past medical history of CKD, type 2 diabetes, and bilateral knee arthritis who presents for hyperglycemia, dizziness, blurred vision, and generalized weakness that have been worsening over the last 2-3 weeks.  Patient states that he very rarely checks his sugar but as he started having the symptoms he found his fingerstick blood glucose at home to be in the 400s.  Patient was concurrently feeling orthostatic lightheadedness, blurred vision, increased reflux, and decreased sensation in bilateral lower extremities.  Patient's current insulin regimen is long-acting subcu once a day in addition to a sulfonylurea.  Patient states that he used to be on short acting insulin but after losing weight was able to come off of it.  Patient does state that recently he has been gaining weight significantly as well as decreased his time exercising due to increased job requirements.  Patient currently only complains of mild lightheadedness.  Patient currently denies any tinnitus, difficulty speaking, facial droop, sore throat, chest pain, shortness of breath, abdominal pain, nausea/vomiting/diarrhea, dysuria, or numbness/paresthesias in any extremity         Past Medical History:  Diagnosis Date  . Anxiety   . CKD (chronic kidney disease)   . Diabetes mellitus without complication ([place])    type 2  . Essential hypertension   . Polycystic kidney   . PTSD (post-traumatic stress disorder)    d/t war experiences    There are no problems to display for this patient.   Past Surgical History:  Procedure Laterality Date  . FOREIGN BODY  REMOVAL     x 2, schrapnel removed from head and arm.   . TRICEPS TENDON REPAIR Left 04/30/2019   Procedure: LEFT TRICEPS REPAIR;  Surgeon: Leim Fabry, MD;  Location: [facility] ORS;  Service: Orthopedics;  Laterality: Left;    Prior to Admission medications   Medication Sig Start Date End Date Taking? Authorizing Provider  acetaminophen (TYLENOL) 500 MG tablet Take 2 tablets (1,000 mg total) by mouth every 8 (eight) hours. 04/30/19 04/29/20  Leim Fabry, MD  amLODipine (NORVASC) 10 MG tablet Take 10 mg by mouth daily.    [provider]  APPLE CIDER VINEGAR PO Take 30 mLs by mouth daily. 2  shots    [provider]  aspirin EC 81 MG tablet Take 81 mg by mouth daily.    [provider]  cholecalciferol (VITAMIN D3) 25 MCG (1000 UNIT) tablet Take 1,000 Units by mouth daily.    [provider]  glipiZIDE (GLUCOTROL) 10 MG tablet Take 10 mg by mouth daily before breakfast.     [provider]  mupirocin cream (BACTROBAN) 2 % Apply 1 application topically 2 (two) times daily. 06/21/19   Sharion Balloon, NP  ondansetron (ZOFRAN ODT) 4 MG disintegrating tablet Take 1 tablet (4 mg total) by mouth every 8 (eight) hours as needed for nausea or vomiting. 04/30/19   Leim Fabry, MD  oxyCODONE (ROXICODONE) 5 MG immediate release tablet Take 1-2 tablets (5-10 mg total) by mouth every 4 (four) hours as needed (pain). 04/30/19 04/29/20  Leim Fabry, MD    Allergies Patient has no known allergies.  Family  History  Problem Relation Age of Onset  . Cancer Mother   . Heart attack Father     Social History Social History   Tobacco Use  . Smoking status: Never Smoker  . Smokeless tobacco: Current User  Vaping Use  . Vaping Use: Never used  Substance Use Topics  . Alcohol use: Yes    Comment: social  . Drug use: Not Currently    Review of Systems Constitutional: No fever/chills Eyes: Endorses visual changes. ENT: No sore throat. Cardiovascular: Denies chest  pain. Respiratory: Denies shortness of breath. Gastrointestinal: No abdominal pain.  No nausea, no vomiting.  No diarrhea. Genitourinary: Negative for dysuria. Musculoskeletal: Negative for acute arthralgias Skin: Negative for rash. Neurological: Negative for headaches, weakness/numbness/paresthesias in any extremity Psychiatric: Negative for suicidal ideation/homicidal ideation   ____________________________________________   PHYSICAL EXAM:  VITAL SIGNS: ED Triage Vitals  Enc Vitals Group     BP 01/10/20 0616 (!) 157/82     Pulse Rate 01/10/20 0616 (!) 104     Resp 01/10/20 0616 18     Temp 01/10/20 0616 98.6 F (37 C)     Temp Source 01/10/20 0616 Oral     SpO2 01/10/20 0616 97 %     Weight 01/10/20 0614 209 lb (94.8 kg)     Height 01/10/20 0614 5\' 9"  (1.753 m)     Head Circumference --      Peak Flow --      Pain Score 01/10/20 0614 0     Pain Loc --      Pain Edu? --      Excl. in [place]? --    Constitutional: Alert and oriented. Well appearing and in no acute distress. Eyes: Conjunctivae are normal. PERRL. Head: Atraumatic. Nose: No congestion/rhinnorhea. Mouth/Throat: Mucous membranes are moist. Neck: No stridor Cardiovascular: Grossly normal heart sounds.  Good peripheral circulation. Respiratory: Normal respiratory effort.  No retractions. Gastrointestinal: Soft and nontender. No distention. Musculoskeletal: No obvious deformities Neurologic:  Normal speech and language. No gross focal neurologic deficits are appreciated. Skin:  Skin is warm and dry. No rash noted. Psychiatric: Mood and affect are normal. Speech and behavior are normal.  ____________________________________________   LABS (all labs ordered are listed, but only abnormal results are displayed)  Labs Reviewed  COMPREHENSIVE METABOLIC PANEL - Abnormal; Notable for the following components:      Result Value   Sodium 134 (*)    Glucose, Bld 381 (*)    BUN 40 (*)    Creatinine, Ser 2.79 (*)     AST 12 (*)    GFR, Estimated 27 (*)    All other components within normal limits  URINALYSIS, COMPLETE (UACMP) WITH MICROSCOPIC - Abnormal; Notable for the following components:   Color, Urine STRAW (*)    APPearance CLEAR (*)    Glucose, UA >=500 (*)    Hgb urine dipstick SMALL (*)    Protein, ur 30 (*)    All other components within normal limits  BLOOD GAS, VENOUS - Abnormal; Notable for the following components:   pCO2, Ven 42 (*)    pO2, Ven 51.0 (*)    Acid-base deficit 3.0 (*)    All other components within normal limits  CBG MONITORING, ED - Abnormal; Notable for the following components:   Glucose-Capillary 369 (*)    All other components within normal limits  CBC WITH DIFFERENTIAL/PLATELET  BETA-HYDROXYBUTYRIC ACID  TROPONIN I (HIGH SENSITIVITY)  TROPONIN I (HIGH SENSITIVITY)   ____________________________________________  EKG  ED ECG REPORT I, Naaman Plummer, the attending physician, personally viewed and interpreted this ECG.  Date: 01/10/2020 EKG Time: 0615 Rate: 108 Rhythm: Tachycardic sinus rhythm QRS Axis: normal Intervals: normal ST/T Wave abnormalities: normal Narrative Interpretation: no evidence of acute ischemia   PROCEDURES  Procedure(s) performed (including Critical Care):  .1-3 Lead EKG Interpretation Performed by: Naaman Plummer, MD Authorized by: Naaman Plummer, MD     Interpretation: normal     ECG rate:  88   ECG rate assessment: normal     Rhythm: sinus rhythm     Ectopy: none     Conduction: normal       ____________________________________________   INITIAL IMPRESSION / ASSESSMENT AND PLAN / ED COURSE  As part of my medical decision making, I reviewed the following data within the [place] notes reviewed and incorporated, Labs reviewed, EKG interpreted, Old chart reviewed, and Notes from prior ED visits reviewed and incorporated        Patients presentation most consistent with  hyperglycemic state WITHOUT evidence of DKA. Given Exam, History, and Workup I have low suspicion for an emergent precipitating factor of this hyperglycemic state such as atypical MI, acute abdomen, or other serious bacterial illness. Patient is Type 2 Diabetic with changes in medication regimen/adherence.  Findings: Patient without AGAP or significant ketones in urine to suggest DKA Interventions: IVF bolus  Re-evaluation: Patients serum glucose downtrended significantly with stable electrolytes and no anion gap at this time.  Disposition: Discharge home with appropriate insulin regimen and prompt PCP follow up instructions.      ____________________________________________   FINAL CLINICAL IMPRESSION(S) / ED DIAGNOSES  Final diagnoses:  Hyperglycemia  Dehydration  Dizziness  Blurred vision     ED Discharge Orders    None       Note:  This document was prepared using Dragon voice recognition software and may include unintentional dictation errors.   Naaman Plummer, MD 01/10/20 [phone]

## 2020-01-10 NOTE — ED Notes (Signed)
Pt ambulatory to restroom complaining dizziness. EDP aware

## 2020-02-07 ENCOUNTER — Emergency Department: Payer: BC Managed Care – PPO

## 2020-02-07 ENCOUNTER — Other Ambulatory Visit: Payer: Self-pay

## 2020-02-07 ENCOUNTER — Encounter: Payer: Self-pay | Admitting: Emergency Medicine

## 2020-02-07 ENCOUNTER — Inpatient Hospital Stay
Admission: EM | Admit: 2020-02-07 | Discharge: 2020-02-10 | DRG: 177 | Disposition: A | Discharge status: Home / Self Care | Payer: BC Managed Care – PPO | Attending: Hospitalist | Admitting: Hospitalist

## 2020-02-07 DIAGNOSIS — E875 Hyperkalemia: Secondary | ICD-10-CM | POA: Diagnosis present

## 2020-02-07 DIAGNOSIS — F419 Anxiety disorder, unspecified: Secondary | ICD-10-CM | POA: Diagnosis present

## 2020-02-07 DIAGNOSIS — E1122 Type 2 diabetes mellitus with diabetic chronic kidney disease: Secondary | ICD-10-CM | POA: Diagnosis present

## 2020-02-07 DIAGNOSIS — Z794 Long term (current) use of insulin: Secondary | ICD-10-CM

## 2020-02-07 DIAGNOSIS — Z8249 Family history of ischemic heart disease and other diseases of the circulatory system: Secondary | ICD-10-CM

## 2020-02-07 DIAGNOSIS — I129 Hypertensive chronic kidney disease with stage 1 through stage 4 chronic kidney disease, or unspecified chronic kidney disease: Secondary | ICD-10-CM | POA: Diagnosis present

## 2020-02-07 DIAGNOSIS — Z79899 Other long term (current) drug therapy: Secondary | ICD-10-CM

## 2020-02-07 DIAGNOSIS — U071 COVID-19: Principal | ICD-10-CM

## 2020-02-07 DIAGNOSIS — J1282 Pneumonia due to coronavirus disease 2019: Secondary | ICD-10-CM | POA: Diagnosis not present

## 2020-02-07 DIAGNOSIS — N184 Chronic kidney disease, stage 4 (severe): Secondary | ICD-10-CM | POA: Diagnosis present

## 2020-02-07 DIAGNOSIS — F4310 Post-traumatic stress disorder, unspecified: Secondary | ICD-10-CM | POA: Diagnosis present

## 2020-02-07 DIAGNOSIS — Z7982 Long term (current) use of aspirin: Secondary | ICD-10-CM

## 2020-02-07 DIAGNOSIS — N281 Cyst of kidney, acquired: Secondary | ICD-10-CM | POA: Diagnosis present

## 2020-02-07 DIAGNOSIS — J9601 Acute respiratory failure with hypoxia: Secondary | ICD-10-CM | POA: Diagnosis present

## 2020-02-07 DIAGNOSIS — E1165 Type 2 diabetes mellitus with hyperglycemia: Secondary | ICD-10-CM | POA: Diagnosis present

## 2020-02-07 LAB — CBC
HCT: 42.3 % (ref 39.0–52.0)
Hemoglobin: 13.7 g/dL (ref 13.0–17.0)
MCH: 29 pg (ref 26.0–34.0)
MCHC: 32.4 g/dL (ref 30.0–36.0)
MCV: 89.4 fL (ref 80.0–100.0)
Platelets: 264 10*3/uL (ref 150–400)
RBC: 4.73 MIL/uL (ref 4.22–5.81)
RDW: 12.6 % (ref 11.5–15.5)
WBC: 6.1 10*3/uL (ref 4.0–10.5)
nRBC: 0 % (ref 0.0–0.2)

## 2020-02-07 LAB — BASIC METABOLIC PANEL
Anion gap: 14 (ref 5–15)
BUN: 36 mg/dL — ABNORMAL HIGH (ref 6–20)
CO2: 22 mmol/L (ref 22–32)
Calcium: 8.9 mg/dL (ref 8.9–10.3)
Chloride: 102 mmol/L (ref 98–111)
Creatinine, Ser: 3.05 mg/dL — ABNORMAL HIGH (ref 0.61–1.24)
GFR, Estimated: 24 mL/min — ABNORMAL LOW (ref >60)
Glucose, Bld: 120 mg/dL — ABNORMAL HIGH (ref 70–99)
Potassium: 4.6 mmol/L (ref 3.5–5.1)
Sodium: 138 mmol/L (ref 135–145)

## 2020-02-07 LAB — HEPATIC FUNCTION PANEL
ALT: 24 U/L (ref 0–44)
AST: 26 U/L (ref 15–41)
Albumin: 3.9 g/dL (ref 3.5–5.0)
Alkaline Phosphatase: 49 U/L (ref 38–126)
Bilirubin, Direct: 0.1 mg/dL (ref 0.0–0.2)
Indirect Bilirubin: 0.7 mg/dL (ref 0.3–0.9)
Total Bilirubin: 0.8 mg/dL (ref 0.3–1.2)
Total Protein: 8 g/dL (ref 6.5–8.1)

## 2020-02-07 LAB — TROPONIN I (HIGH SENSITIVITY)
Troponin I (High Sensitivity): 15 ng/L (ref <18)
Troponin I (High Sensitivity): 15 ng/L (ref <18)

## 2020-02-07 MED ORDER — ONDANSETRON HCL 4 MG/2ML IJ SOLN
4.0000 mg | Freq: Once | INTRAMUSCULAR | Status: AC
  Administered 2020-02-07: 4 mg via INTRAVENOUS
  Filled 2020-02-07: qty 2

## 2020-02-07 MED ORDER — PREDNISONE 10 MG PO TABS: 10.0000 mg | ORAL_TABLET | Freq: Every day | ORAL | 0 refills | Status: DC

## 2020-02-07 MED ORDER — ACETAMINOPHEN 500 MG PO TABS
1000.0000 mg | ORAL_TABLET | Freq: Once | ORAL | Status: AC
  Administered 2020-02-07: 1000 mg via ORAL
  Filled 2020-02-07: qty 2

## 2020-02-07 MED ORDER — GUAIFENESIN-CODEINE 100-10 MG/5ML PO SOLN: 5.0000 mL | Freq: Four times a day (QID) | ORAL | 0 refills | Status: DC | PRN

## 2020-02-07 MED ORDER — SODIUM CHLORIDE 0.9 % IV BOLUS
1000.0000 mL | Freq: Once | INTRAVENOUS | Status: AC
  Administered 2020-02-07: 1000 mL via INTRAVENOUS

## 2020-02-07 MED ORDER — DEXAMETHASONE SODIUM PHOSPHATE 10 MG/ML IJ SOLN
10.0000 mg | Freq: Once | INTRAMUSCULAR | Status: AC
  Administered 2020-02-07: 10 mg via INTRAVENOUS
  Filled 2020-02-07: qty 1

## 2020-02-07 MED ORDER — SODIUM CHLORIDE 0.9 % IV SOLN
100.0000 mg | Freq: Every day | INTRAVENOUS | Status: DC
  Administered 2020-02-08 – 2020-02-10 (×3): 100 mg via INTRAVENOUS
  Filled 2020-02-07 (×3): qty 20

## 2020-02-07 MED ORDER — SODIUM CHLORIDE 0.9 % IV SOLN
200.0000 mg | Freq: Once | INTRAVENOUS | Status: AC
  Administered 2020-02-08: 200 mg via INTRAVENOUS
  Filled 2020-02-07: qty 40

## 2020-02-07 MED ORDER — ONDANSETRON HCL 4 MG PO TABS: 4.0000 mg | ORAL_TABLET | Freq: Every day | ORAL | 0 refills | Status: DC | PRN

## 2020-02-07 NOTE — H&P (Signed)
History and Physical    Jeffery Hobbs [id number] DOB: [birth year] DOA: 02/07/2020  PCP: [facility]  Patient coming from: home  I have personally briefly reviewed patient's old medical records in [place]  Chief Complaint: sob cough [name]  Hx of covid exposure  HPI: Jeffery Hobbs is a 50 y.o. male with medical history significant of  PKD with CKDIII, DMII insulin dependent, PTSD, HTN who presents to ed with  sob cough /fever  Hx of covid exposure.  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 He states he has been ill x 2-3 days with cough , fever/ chills, HA , chest pain related to coughing as well as nausea. He denies any diarreha , body aches but does feel generally unwell. He notes that his sob is worse with talking and exertion. He states while in ED he was called by his work facility with +  positive COVID test results.  ED Course:  Temp 99.7, bp 159/100 ,hr 126, rr 26, sat 91% on RA with drop to 88 % , place on 2L [place] with improvement to 99% Labs: ast 26, alt 24 ce 15 Wbc: 6.1, hgb 13.7, plt 264 Cr: 3.05 from 2.7  Base 3 to 3.7.  Tx: decadron, zofran tylenol  Review of Systems: As per HPI otherwise 10 point review of systems negative.   Past Medical History:  Diagnosis Date   Anxiety    CKD (chronic kidney disease)    Diabetes mellitus without complication ([place])    type 2   Essential hypertension    Polycystic kidney    PTSD (post-traumatic stress disorder)    d/t war experiences    Past Surgical History:  Procedure Laterality Date   FOREIGN BODY REMOVAL     x 2, schrapnel removed from head and arm.    TRICEPS TENDON REPAIR Left 04/30/2019   Procedure: LEFT TRICEPS REPAIR;  Surgeon: Jeffery Fabry, MD;  Location: [facility] ORS;  Service: Orthopedics;  Laterality: Left;     reports that he has never smoked. He uses smokeless tobacco. He reports current alcohol use. He reports  previous drug use.  No Known Allergies  Family History  Problem Relation Age of Onset   Cancer Mother    Heart attack Father     Prior to Admission medications   Medication Sig Start Date End Date Taking? Authorizing Provider  amLODipine (NORVASC) 10 MG tablet Take 10 mg by mouth daily.   Yes [provider]  aspirin EC 81 MG tablet Take 81 mg by mouth daily.   Yes [provider]  cholecalciferol (VITAMIN D3) 25 MCG (1000 UNIT) tablet Take 1,000 Units by mouth daily.   Yes [provider]  EPINEPHrine 0.3 mg/0.3 mL IJ SOAJ injection Inject 0.3 mLs into the muscle once as needed for anaphylaxis. 07/05/19 07/05/20 Yes [provider]  glipiZIDE (GLUCOTROL) 10 MG tablet Take 10 mg by mouth daily before breakfast.    Yes [provider]  guaiFENesin-codeine 100-10 MG/5ML syrup Take 5 mLs by mouth every 6 (six) hours as needed for cough. 02/07/20  Yes Harvest Dark, MD  insulin lispro (HUMALOG) 100 UNIT/ML KwikPen Inject 12 Units into the skin 3 (three) times daily with meals. 01/10/20 01/09/21 Yes [provider]  LANTUS SOLOSTAR 100 UNIT/ML Solostar Pen Inject 30 Units into the skin at bedtime. 11/16/19  Yes [provider]  ondansetron (ZOFRAN) 4 MG tablet Take  1 tablet (4 mg total) by mouth daily as needed for nausea or vomiting. 02/07/20  Yes Harvest Dark, MD  predniSONE (DELTASONE) 10 MG tablet Take 1 tablet (10 mg total) by mouth daily. Day 1-3: take 4 tablets PO daily Day 4-6: take 3 tablets PO daily Day 7-9: take 2 tablets PO daily Day 10-12: take 1 tablet PO daily 02/07/20  Yes Harvest Dark, MD  atorvastatin (LIPITOR) 10 MG tablet Take 10 mg by mouth daily. 11/16/19   [provider]  losartan (COZAAR) 50 MG tablet Take 50 mg by mouth daily. 11/16/19   [provider]    Physical Exam: Vitals:   02/07/20 1331 02/07/20 1334 02/07/20 1725 02/07/20 1907  BP:   139/88   Pulse:   (!) 107    Resp:   18   Temp:   99.2 F (37.3 C)   TempSrc:   Oral   SpO2:  99% 96% 94%  Weight: 99.3 kg     Height: '5\' 9"'$  (1.753 m)       Constitutional: NAD, calm, comfortable Vitals:   02/07/20 1331 02/07/20 1334 02/07/20 1725 02/07/20 1907  BP:   139/88   Pulse:   (!) 107   Resp:   18   Temp:   99.2 F (37.3 C)   TempSrc:   Oral   SpO2:  99% 96% 94%  Weight: 99.3 kg     Height: '5\' 9"'$  (1.753 m)      Eyes: PERRL, lids and conjunctivae normal ENMT: Mucous membranes are moist. Posterior pharynx clear of any exudate or lesions.Normal dentition.  Neck: normal, supple, no masses, no thyromegaly Respiratory: + rhonchi/ coarse bs, wheezing, no crackles. Normal respiratory effort. No accessory muscle use.  Cardiovascular: Regular rate and rhythm, no murmurs / rubs / gallops. No extremity edema. 2+ pedal pulses. No carotid bruits.  Abdomen: no tenderness, no masses palpated. No hepatosplenomegaly. Bowel sounds positive.  Musculoskeletal: no clubbing / cyanosis. No joint deformity upper and lower extremities. Good ROM, no contractures. Normal muscle tone.  Skin: no rashes, lesions, ulcers. No induration Neurologic: CN 2-12 grossly intact. Sensation intact,  Strength 5/5 in all 4.  Psychiatric: Normal judgment and insight. Alert and oriented x 3. Normal mood.    Labs on Admission: I have personally reviewed following labs and imaging studies  CBC: Recent Labs  Lab 02/07/20 1336  WBC 6.1  HGB 13.7  HCT 42.3  MCV 89.4  PLT [id number]   Basic Metabolic Panel: Recent Labs  Lab 02/07/20 1336  NA 138  K 4.6  CL 102  CO2 22  GLUCOSE 120*  BUN 36*  CREATININE 3.05*  CALCIUM 8.9   GFR: Estimated Creatinine Clearance: 32.9 mL/min (A) (by C-G formula based on SCr of 3.05 mg/dL (H)). Liver Function Tests: Recent Labs  Lab 02/07/20 1335  AST 26  ALT 24  ALKPHOS 49  BILITOT 0.8  PROT 8.0  ALBUMIN 3.9   No results for input(s): LIPASE, AMYLASE in the last 168 hours. No results for  input(s): AMMONIA in the last 168 hours. Coagulation Profile: No results for input(s): INR, PROTIME in the last 168 hours. Cardiac Enzymes: No results for input(s): CKTOTAL, CKMB, CKMBINDEX, TROPONINI in the last 168 hours. BNP (last 3 results) No results for input(s): PROBNP in the last 8760 hours. HbA1C: No results for input(s): HGBA1C in the last 72 hours. CBG: No results for input(s): GLUCAP in the last 168 hours. Lipid Profile: No results for input(s): CHOL, HDL, LDLCALC,  TRIG, CHOLHDL, LDLDIRECT in the last 72 hours. Thyroid Function Tests: No results for input(s): TSH, T4TOTAL, FREET4, T3FREE, THYROIDAB in the last 72 hours. Anemia Panel: No results for input(s): VITAMINB12, FOLATE, FERRITIN, TIBC, IRON, RETICCTPCT in the last 72 hours. Urine analysis:    Component Value Date/Time   COLORURINE STRAW (A) 01/10/2020 0831   APPEARANCEUR CLEAR (A) 01/10/2020 0831   LABSPEC 1.014 01/10/2020 0831   PHURINE 5.0 01/10/2020 0831   GLUCOSEU >=500 (A) 01/10/2020 0831   HGBUR SMALL (A) 01/10/2020 0831   BILIRUBINUR NEGATIVE 01/10/2020 0831   BILIRUBINUR negative 04/13/2019 1205   KETONESUR NEGATIVE 01/10/2020 0831   PROTEINUR 30 (A) 01/10/2020 0831   UROBILINOGEN 0.2 04/13/2019 1205   NITRITE NEGATIVE 01/10/2020 0831   LEUKOCYTESUR NEGATIVE 01/10/2020 0831    Radiological Exams on Admission: DG Chest 2 View  Result Date: 02/07/2020 CLINICAL DATA:  POV c/o SHOB and cough. 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 Pt has labored breathing with no exertion and states it started last night. Hx of Diabetes, htn, polycystic kidney disease. Nonsmoker. EXAM: CHEST - 2 VIEW COMPARISON:  01/08/2020 FINDINGS: Patchy hazy airspace lung opacity bilaterally most evident in the right mid and lower lung consistent with multifocal pneumonia. There is no evidence of pulmonary edema. No pleural effusion and no  pneumothorax. Heart is normal in size and configuration. Normal mediastinal and hilar contours. Skeletal structures are unremarkable. IMPRESSION: 1. Bilateral mid and lower lung zone airspace opacities, greater on the right, consistent multifocal pneumonia, pattern compatible with COVID-19 infection. Electronically Signed   By: Lajean Manes M.D.   On: 02/07/2020 14:44    EKG: Independently reviewed. Sinus tachycardia no hyperacute st-t wave chagnes Assessment/Plan COVID viral Pneumonia with associated hypoxic respiratory failure  -decadron '6mg'$  po x 10 days  -  remdesivir per pharmacy protocol -patient currently with low O2 requirement ,wean O2 as able  -self prone as able  -encourage po fluid intake  -daily monitoring of COVID severity labs -d-dimer pending , if + consider  CTPA  -dvt ppx per protocol   DMII -resume lantus and pre-meal insulin  - iss/fs  -currently controlled fs 120's  -hold oral hypoglycemics for now  HTN -resume home regimen   CKDIII -due to PKD -at baseline  -monitor labs     DVT prophylaxis: lovenox  Code Status:full Family Communication:  None at beside Disposition Plan: patient  expected to be admitted greater than 2 midnights Consults called:n/a Admission status: inpatient med/surg   Clance Boll MD [facility]  If 7PM-7AM, please contact night-coverage [email or web address] Password [facility]  02/07/2020, 11:23 PM

## 2020-02-07 NOTE — Progress Notes (Signed)
Remdesivir - Pharmacy Brief Note   O:  ALT: 20 CXR: SpO2: 93 % on RA   A/P:  Remdesivir 200 mg IVPB once followed by 100 mg IVPB daily x 4 days.   Geral Tuch D 02/07/2020 8:19 PM

## 2020-02-07 NOTE — ED Triage Notes (Signed)
Pt comes into the ED via POV c/o SHOB and cough.  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  Pt has labored breathing with no exertion and states it started last night.  Pt denies any COPD or asthma.  Room air saturation levels in the low 90's.

## 2020-02-07 NOTE — ED Notes (Signed)
Blue top sent to lab with save label.

## 2020-02-07 NOTE — ED Provider Notes (Addendum)
[facility] Emergency Department Provider Note  Time seen: 6:08 PM  I have reviewed the triage vital signs and the nursing notes.   HISTORY  Chief Complaint Shortness of Breath and Cough   HPI Jeffery Hobbs is a 50 y.o. male with a past medical history of chronic kidney disease, diabetes, hypertension, presents to the emergency department for shortness of breath and cough. 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 Patient states on Tuesday he began feeling unwell with nausea cough which has progressed to more shortness of breath. Patient states he had a COVID test performed at work and received a call last night that he was positive. Patient was concerned given his worsening shortness of breath so he came to the emergency department for evaluation.   Past Medical History:  Diagnosis Date  . Anxiety   . CKD (chronic kidney disease)   . Diabetes mellitus without complication ([place])    type 2  . Essential hypertension   . Polycystic kidney   . PTSD (post-traumatic stress disorder)    d/t war experiences    There are no problems to display for this patient.   Past Surgical History:  Procedure Laterality Date  . FOREIGN BODY REMOVAL     x 2, schrapnel removed from head and arm.   . TRICEPS TENDON REPAIR Left 04/30/2019   Procedure: LEFT TRICEPS REPAIR;  Surgeon: Leim Fabry, MD;  Location: [facility] ORS;  Service: Orthopedics;  Laterality: Left;    Prior to Admission medications   Medication Sig Start Date End Date Taking? Authorizing Provider  acetaminophen (TYLENOL) 500 MG tablet Take 2 tablets (1,000 mg total) by mouth every 8 (eight) hours. 04/30/19 04/29/20  Leim Fabry, MD  amLODipine (NORVASC) 10 MG tablet Take 10 mg by mouth daily.    [provider]  APPLE CIDER VINEGAR PO Take 30 mLs by mouth daily. 2  shots    [provider]  aspirin EC 81 MG tablet Take 81 mg  by mouth daily.    [provider]  cholecalciferol (VITAMIN D3) 25 MCG (1000 UNIT) tablet Take 1,000 Units by mouth daily.    [provider]  glipiZIDE (GLUCOTROL) 10 MG tablet Take 10 mg by mouth daily before breakfast.     [provider]  mupirocin cream (BACTROBAN) 2 % Apply 1 application topically 2 (two) times daily. 06/21/19   Sharion Balloon, NP  ondansetron (ZOFRAN ODT) 4 MG disintegrating tablet Take 1 tablet (4 mg total) by mouth every 8 (eight) hours as needed for nausea or vomiting. 04/30/19   Leim Fabry, MD  oxyCODONE (ROXICODONE) 5 MG immediate release tablet Take 1-2 tablets (5-10 mg total) by mouth every 4 (four) hours as needed (pain). 04/30/19 04/29/20  Leim Fabry, MD    No Known Allergies  Family History  Problem Relation Age of Onset  . Cancer Mother   . Heart attack Father     Social History Social History   Tobacco Use  . Smoking status: Never Smoker  . Smokeless tobacco: Current User  Vaping Use  . Vaping Use: Never used  Substance Use Topics  . Alcohol use: Yes    Comment: social  . Drug use: Not Currently    Review of Systems Constitutional: Negative for fever. Cardiovascular: Negative for chest pain. Respiratory: Positive for shortness of breath. Positive for cough. Gastrointestinal: Negative for abdominal pain. Positive for nausea  but negative for vomiting. Musculoskeletal: Negative for musculoskeletal complaints All other ROS negative  ____________________________________________   PHYSICAL EXAM:  VITAL SIGNS: ED Triage Vitals  Enc Vitals Group     BP 02/07/20 1330 (!) 159/100     Pulse Rate 02/07/20 1330 (!) 126     Resp 02/07/20 1330 (!) 26     Temp 02/07/20 1330 99.7 F (37.6 C)     Temp Source 02/07/20 1330 Oral     SpO2 02/07/20 1330 91 %     Weight 02/07/20 1331 219 lb (99.3 kg)     Height 02/07/20 1331 '5\' 9"'$  (1.753 m)     Head Circumference --      Peak Flow --      Pain Score 02/07/20 1331 6      Pain Loc --      Pain Edu? --      Excl. in [place]? --    Constitutional: Alert and oriented. Well appearing and in no distress. Eyes: Normal exam ENT      Head: Normocephalic and atraumatic.      Mouth/Throat: Mucous membranes are moist. Cardiovascular: Normal rate, regular rhythm around 100 bpm.  Respiratory: Normal respiratory effort without tachypnea nor retractions. Breath sounds are clear  Gastrointestinal: Soft and nontender. No distention.   Musculoskeletal: Nontender with normal range of motion in all extremities. Neurologic:  Normal speech and language. No gross focal neurologic deficits  Skin:  Skin is warm, dry and intact.  Psychiatric: Mood and affect are normal.  ____________________________________________    EKG  EKG viewed and interpreted by myself shows sinus tachycardia 119 bpm with a narrow QRS, normal axis, normal intervals with nonspecific ST changes.  ____________________________________________    RADIOLOGY  Chest x-ray consistent multifocal pneumonia  ____________________________________________   INITIAL IMPRESSION / ASSESSMENT AND PLAN / ED COURSE  Pertinent labs & imaging results that were available during my care of the patient were reviewed by me and considered in my medical decision making (see chart for details).   Patient presents to the emergency department after testing positive for COVID yesterday with worsening shortness of breath cough. Patient states he has been using over-the-counter cough medication without success. Patient denies any known fever. Has been nauseated but denies vomiting. Patient's lab work is overall reassuring chronic kidney disease largely unchanged. Blood glucose well controlled. Troponin negative. Chest x-ray consistent with multifocal pneumonia consistent with his known COVID-19 positive status. EKG shows sinus tachycardia otherwise nonrevealing. Patient currently satting 93% on room air. We will continue to closely  monitor. We will dose IV Decadron, IV fluids, oral Tylenol and reassess.  Patient's repeat troponin remains negative.  Patient's room air saturation continues around 92-94%.  Given the patient's COVID positive status with an overall reassuring exam I believe the patient is safe for discharge home with a steroid taper cough medication and nausea medication.  I discussed supportive care at home including fluids Tylenol.  Patient does have chronic kidney disease discussed limiting ibuprofen.  Patient has diabetes I discussed monitoring his blood glucose and adjusting his Lantus as needed.  Given the patient's comorbidities and being on his third day of symptoms I did discuss monoclonal antibodies.  I have sent a referral for the patient.  I also discussed my typical COVID return precautions for any worsening shortness of breath vomiting unable to keep down fluids or confusion.  Patient agreeable to plan of care.  Upon attempting to discharge patient he was noted to have dropped his O2  saturation 88% with a good waveform.  Given the patient's multiple medical conditions now hypoxia on room air although corrected easily with deep breathing to 92%, 95% on 2 L of oxygen.  We will admit the patient to the hospital service for IV remdesivir.  Patient agreeable to plan of care.  Senna Stotz was evaluated in Emergency Department on 02/07/2020 for the symptoms described in the history of present illness. He was evaluated in the context of the global COVID-19 pandemic, which necessitated consideration that the patient might be at risk for infection with the SARS-CoV-2 virus that causes COVID-19. Institutional protocols and algorithms that pertain to the evaluation of patients at risk for COVID-19 are in a state of rapid change based on information released by regulatory bodies including the CDC and federal and state organizations. These policies and algorithms were followed during the patient's care in the  ED.  ____________________________________________   FINAL CLINICAL IMPRESSION(S) / ED DIAGNOSES  COVID-19 Dyspnea   Harvest Dark, MD 02/07/20 Patrecia Pour    Harvest Dark, MD 02/07/20 2013

## 2020-02-08 DIAGNOSIS — Z8249 Family history of ischemic heart disease and other diseases of the circulatory system: Secondary | ICD-10-CM | POA: Diagnosis not present

## 2020-02-08 DIAGNOSIS — N281 Cyst of kidney, acquired: Secondary | ICD-10-CM | POA: Diagnosis present

## 2020-02-08 DIAGNOSIS — Z794 Long term (current) use of insulin: Secondary | ICD-10-CM | POA: Diagnosis not present

## 2020-02-08 DIAGNOSIS — I129 Hypertensive chronic kidney disease with stage 1 through stage 4 chronic kidney disease, or unspecified chronic kidney disease: Secondary | ICD-10-CM | POA: Diagnosis present

## 2020-02-08 DIAGNOSIS — J9601 Acute respiratory failure with hypoxia: Secondary | ICD-10-CM | POA: Diagnosis present

## 2020-02-08 DIAGNOSIS — N184 Chronic kidney disease, stage 4 (severe): Secondary | ICD-10-CM

## 2020-02-08 DIAGNOSIS — U071 COVID-19: Secondary | ICD-10-CM | POA: Diagnosis present

## 2020-02-08 DIAGNOSIS — E875 Hyperkalemia: Secondary | ICD-10-CM | POA: Diagnosis present

## 2020-02-08 DIAGNOSIS — J1282 Pneumonia due to coronavirus disease 2019: Secondary | ICD-10-CM | POA: Diagnosis present

## 2020-02-08 DIAGNOSIS — Z7982 Long term (current) use of aspirin: Secondary | ICD-10-CM | POA: Diagnosis not present

## 2020-02-08 DIAGNOSIS — E1122 Type 2 diabetes mellitus with diabetic chronic kidney disease: Secondary | ICD-10-CM | POA: Diagnosis present

## 2020-02-08 DIAGNOSIS — F4310 Post-traumatic stress disorder, unspecified: Secondary | ICD-10-CM | POA: Diagnosis present

## 2020-02-08 DIAGNOSIS — F419 Anxiety disorder, unspecified: Secondary | ICD-10-CM | POA: Diagnosis present

## 2020-02-08 DIAGNOSIS — Z79899 Other long term (current) drug therapy: Secondary | ICD-10-CM | POA: Diagnosis not present

## 2020-02-08 DIAGNOSIS — E1165 Type 2 diabetes mellitus with hyperglycemia: Secondary | ICD-10-CM | POA: Diagnosis present

## 2020-02-08 LAB — CREATININE, SERUM
Creatinine, Ser: 2.79 mg/dL — ABNORMAL HIGH (ref 0.61–1.24)
GFR, Estimated: 26 mL/min — ABNORMAL LOW (ref >60)

## 2020-02-08 LAB — CBC
HCT: 40.3 % (ref 39.0–52.0)
Hemoglobin: 12.8 g/dL — ABNORMAL LOW (ref 13.0–17.0)
MCH: 28.7 pg (ref 26.0–34.0)
MCHC: 31.8 g/dL (ref 30.0–36.0)
MCV: 90.4 fL (ref 80.0–100.0)
Platelets: 242 10*3/uL (ref 150–400)
RBC: 4.46 MIL/uL (ref 4.22–5.81)
RDW: 12.8 % (ref 11.5–15.5)
WBC: 2.9 10*3/uL — ABNORMAL LOW (ref 4.0–10.5)
nRBC: 0 % (ref 0.0–0.2)

## 2020-02-08 LAB — HEMOGLOBIN A1C
Hgb A1c MFr Bld: 10.4 % — ABNORMAL HIGH (ref 4.8–5.6)
Mean Plasma Glucose: 251.78 mg/dL

## 2020-02-08 LAB — C-REACTIVE PROTEIN: CRP: 6.9 mg/dL — ABNORMAL HIGH (ref <1.0)

## 2020-02-08 LAB — SARS CORONAVIRUS 2 (TAT 6-24 HRS): SARS Coronavirus 2: POSITIVE — AB

## 2020-02-08 LAB — FIBRIN DERIVATIVES D-DIMER (ARMC ONLY): Fibrin derivatives D-dimer (ARMC): 524.37 ng/mL (FEU) — ABNORMAL HIGH (ref 0.00–499.00)

## 2020-02-08 LAB — GLUCOSE, CAPILLARY: Glucose-Capillary: 197 mg/dL — ABNORMAL HIGH (ref 70–99)

## 2020-02-08 LAB — CBG MONITORING, ED
Glucose-Capillary: 203 mg/dL — ABNORMAL HIGH (ref 70–99)
Glucose-Capillary: 152 mg/dL — ABNORMAL HIGH (ref 70–99)
Glucose-Capillary: 89 mg/dL (ref 70–99)

## 2020-02-08 LAB — HIV ANTIBODY (ROUTINE TESTING W REFLEX): HIV Screen 4th Generation wRfx: NONREACTIVE

## 2020-02-08 LAB — FERRITIN: Ferritin: 574 ng/mL — ABNORMAL HIGH (ref 24–336)

## 2020-02-08 MED ORDER — DEXAMETHASONE 4 MG PO TABS
6.0000 mg | ORAL_TABLET | Freq: Every day | ORAL | Status: DC
  Administered 2020-02-08 – 2020-02-09 (×2): 6 mg via ORAL
  Filled 2020-02-08: qty 2
  Filled 2020-02-08: qty 1

## 2020-02-08 MED ORDER — INSULIN GLARGINE 100 UNIT/ML ~~LOC~~ SOLN
30.0000 [IU] | Freq: Every day | SUBCUTANEOUS | Status: DC
  Filled 2020-02-08 (×4): qty 0.3

## 2020-02-08 MED ORDER — INSULIN ASPART 100 UNIT/ML ~~LOC~~ SOLN
0.0000 [IU] | Freq: Three times a day (TID) | SUBCUTANEOUS | Status: DC
  Administered 2020-02-08 – 2020-02-09 (×2): 5 [IU] via SUBCUTANEOUS
  Administered 2020-02-09 (×2): 3 [IU] via SUBCUTANEOUS
  Administered 2020-02-10: 5 [IU] via SUBCUTANEOUS
  Administered 2020-02-10: 10:00:00 3 [IU] via SUBCUTANEOUS
  Filled 2020-02-08 (×7): qty 1

## 2020-02-08 MED ORDER — ENOXAPARIN SODIUM 60 MG/0.6ML ~~LOC~~ SOLN
50.0000 mg | SUBCUTANEOUS | Status: DC
  Administered 2020-02-08 – 2020-02-10 (×3): 50 mg via SUBCUTANEOUS
  Filled 2020-02-08 (×3): qty 0.6

## 2020-02-08 MED ORDER — INSULIN ASPART 100 UNIT/ML ~~LOC~~ SOLN
12.0000 [IU] | Freq: Three times a day (TID) | SUBCUTANEOUS | Status: DC
  Administered 2020-02-08 (×2): 12 [IU] via SUBCUTANEOUS
  Filled 2020-02-08 (×3): qty 1

## 2020-02-08 MED ORDER — ONDANSETRON HCL 4 MG PO TABS: 4.0000 mg | ORAL_TABLET | Freq: Four times a day (QID) | ORAL | Status: DC | PRN

## 2020-02-08 MED ORDER — ZINC SULFATE 220 (50 ZN) MG PO CAPS
220.0000 mg | ORAL_CAPSULE | Freq: Every day | ORAL | Status: DC
  Administered 2020-02-08 – 2020-02-10 (×3): 220 mg via ORAL
  Filled 2020-02-08 (×3): qty 1

## 2020-02-08 MED ORDER — ASCORBIC ACID 500 MG PO TABS
500.0000 mg | ORAL_TABLET | Freq: Every day | ORAL | Status: DC
  Administered 2020-02-08 – 2020-02-10 (×3): 500 mg via ORAL
  Filled 2020-02-08 (×3): qty 1

## 2020-02-08 MED ORDER — ACETAMINOPHEN 325 MG PO TABS: 650.0000 mg | ORAL_TABLET | Freq: Four times a day (QID) | ORAL | Status: DC | PRN

## 2020-02-08 MED ORDER — AMLODIPINE BESYLATE 10 MG PO TABS
10.0000 mg | ORAL_TABLET | Freq: Every day | ORAL | Status: DC
  Administered 2020-02-08 – 2020-02-10 (×3): 10 mg via ORAL
  Filled 2020-02-08: qty 1
  Filled 2020-02-08: qty 2
  Filled 2020-02-08: qty 1

## 2020-02-08 MED ORDER — SODIUM CHLORIDE 0.9% FLUSH
3.0000 mL | Freq: Two times a day (BID) | INTRAVENOUS | Status: DC
  Administered 2020-02-09 – 2020-02-10 (×4): 3 mL via INTRAVENOUS

## 2020-02-08 MED ORDER — ONDANSETRON HCL 4 MG/2ML IJ SOLN
4.0000 mg | Freq: Four times a day (QID) | INTRAMUSCULAR | Status: DC | PRN
  Administered 2020-02-08 – 2020-02-09 (×2): 4 mg via INTRAVENOUS
  Filled 2020-02-08 (×2): qty 2

## 2020-02-08 MED ORDER — ASPIRIN EC 81 MG PO TBEC
81.0000 mg | DELAYED_RELEASE_TABLET | Freq: Every day | ORAL | Status: DC
  Administered 2020-02-09 – 2020-02-10 (×2): 81 mg via ORAL
  Filled 2020-02-08 (×2): qty 1

## 2020-02-08 NOTE — Progress Notes (Signed)
PHARMACIST - PHYSICIAN COMMUNICATION  CONCERNING:  Enoxaparin (Lovenox) for DVT Prophylaxis    RECOMMENDATION: Patient was prescribed enoxaprin '40mg'$  q24 hours for VTE prophylaxis.   Filed Weights   02/07/20 1331  Weight: 99.3 kg (219 lb)    Body mass index is 32.34 kg/m.  Estimated Creatinine Clearance: 32.9 mL/min (A) (by C-G formula based on SCr of 3.05 mg/dL (H)).   Based on [place] patient is candidate for enoxaparin 0.'5mg'$ /kg TBW SQ every 24 hours based on BMI being >30.  DESCRIPTION: Pharmacy has adjusted enoxaparin dose per [facility] policy.  Patient is now receiving enoxaparin 50 mg every 24 hours    Ena Dawley, PharmD Clinical Pharmacist  02/08/2020 12:40 AM

## 2020-02-08 NOTE — ED Notes (Signed)
Pt resting in bed at this time, provided with water per request. No complaints, call light in reach

## 2020-02-08 NOTE — ED Notes (Signed)
Pt sitting up to chairside on his cellphone talking speaking in clear complete sentences.  RR even and unlabored on 2L O2 via [place]; O2 sats 98% - pt states while on supplemental O2 he denies sob however with exertion on RA he is still sob.  Pt placed on cardiac monitor and is on continuous pulse ox.  Reports feeling nauseated earlier but states nausea resolved after anti-emetic administered; now tolerating PO intake.  Abdomen soft, nontender. Skin warm dry and intact.  Pt provided extra blanket upon request - denies any other questions, concerns, needs.  Will monitor for acute changes and maintain plan of care.

## 2020-02-08 NOTE — Progress Notes (Signed)
PROGRESS NOTE    Jeffery Hobbs  [id number] DOB: Sep 16, 1970 DOA: 02/07/2020 PCP: [place]    Assessment & Plan:   Active Problems:   Pneumonia due to COVID-19 virus   Jeffery Hobbs is a 50 y.o. male with medical history significant of  PKD with CKDIII, DMII insulin dependent, PTSD, HTN who presents to ed with  sob cough /fever  Hx of covid exposure.    Acute hypoxic respiratory failure --2/2 COVID PNA Plan: --Continue supplemental O2 to keep sats >=90%, wean as tolerated --treat covid  COVID viral Pneumonia  --started on oral decadron and Remdesivir --CRP 6.9 Plan: --cont Remdesivir --cont oral decadron --ensure CRP down-trending  DMII poorly controlled --A1c 10.4 Plan: --cont Lantus 30u nightly --cont mealtime 12u TID --SSI TID --Hold home glipizide   CKD4 Hx of polycystic kidney disease --CKD appeared to have progressived from CKD3b to CKD4 in the past year. --last seen nephrology Dr. Holley Raring on 01/24/20 --cont outpatient followup  HTN --home Losartan was placed on hold due to recent hyperkalemia --cont home amlodipine   DVT prophylaxis: Lovenox SQ Code Status: Full code  Family Communication:  Status is: inpatient Dispo:   The patient is from: home Anticipated d/c is to: home Anticipated d/c date is: 2-3 days Patient currently is not medically ready to d/c due to: covid on tx and 4L O2   Subjective and Interval History:  Pt reported some chest tightness, no appetite, nausea.  Dyspnea when not on supplemental oxygen.   Objective: Vitals:   02/08/20 0600 02/08/20 0630 02/08/20 0830 02/08/20 1549  BP: 126/80 104/68 126/79 (!) 141/97  Pulse: 76 78 88 90  Resp: (!) 23 19 (!) 28 18  Temp:      TempSrc:      SpO2: 92% 98% 96% 96%  Weight:      Height:        Intake/Output Summary (Last 24 hours) at 02/08/2020 1817 Last data filed at 02/08/2020 0057 Gross per 24 hour  Intake 1000 ml  Output --  Net 1000 ml   Filed Weights   02/07/20  1331  Weight: 99.3 kg    Examination:   Constitutional: NAD, AAOx3 HEENT: conjunctivae and lids normal, EOMI CV: No cyanosis.   RESP: reduced air movement, on 4L Extremities: No effusions, edema in BLE SKIN: warm, dry Neuro: II - XII grossly intact.   Psych: Normal mood and affect.  Appropriate judgement and reason   Data Reviewed: I have personally reviewed following labs and imaging studies  CBC: Recent Labs  Lab 02/07/20 1336 02/08/20 0159  WBC 6.1 2.9*  HGB 13.7 12.8*  HCT 42.3 40.3  MCV 89.4 90.4  PLT 264 [id number]   Basic Metabolic Panel: Recent Labs  Lab 02/07/20 1336 02/08/20 0159  NA 138  --   K 4.6  --   CL 102  --   CO2 22  --   GLUCOSE 120*  --   BUN 36*  --   CREATININE 3.05* 2.79*  CALCIUM 8.9  --    GFR: Estimated Creatinine Clearance: 36 mL/min (A) (by C-G formula based on SCr of 2.79 mg/dL (H)). Liver Function Tests: Recent Labs  Lab 02/07/20 1335  AST 26  ALT 24  ALKPHOS 49  BILITOT 0.8  PROT 8.0  ALBUMIN 3.9   No results for input(s): LIPASE, AMYLASE in the last 168 hours. No results for input(s): AMMONIA in the last 168 hours. Coagulation Profile: No results for input(s): INR, PROTIME in the  last 168 hours. Cardiac Enzymes: No results for input(s): CKTOTAL, CKMB, CKMBINDEX, TROPONINI in the last 168 hours. BNP (last 3 results) No results for input(s): PROBNP in the last 8760 hours. HbA1C: Recent Labs    02/08/20 0159  HGBA1C 10.4*   CBG: Recent Labs  Lab 02/08/20 0841 02/08/20 1212 02/08/20 1551  GLUCAP 203* 152* 89   Lipid Profile: No results for input(s): CHOL, HDL, LDLCALC, TRIG, CHOLHDL, LDLDIRECT in the last 72 hours. Thyroid Function Tests: No results for input(s): TSH, T4TOTAL, FREET4, T3FREE, THYROIDAB in the last 72 hours. Anemia Panel: Recent Labs    02/08/20 0159  FERRITIN 574*   Sepsis Labs: No results for input(s): PROCALCITON, LATICACIDVEN in the last 168 hours.  Recent Results (from the past 240  hour(s))  SARS CORONAVIRUS 2 (TAT 6-24 HRS) Nasopharyngeal Nasopharyngeal Swab     Status: Abnormal   Collection Time: 02/08/20  1:59 AM   Specimen: Nasopharyngeal Swab  Result Value Ref Range Status   SARS Coronavirus 2 POSITIVE (A) NEGATIVE Final    Comment: (NOTE) SARS-CoV-2 target nucleic acids are DETECTED.  The SARS-CoV-2 RNA is generally detectable in upper and lower respiratory specimens during the acute phase of infection. Positive results are indicative of the presence of SARS-CoV-2 RNA. Clinical correlation with patient history and other diagnostic information is  necessary to determine patient infection status. Positive results do not rule out bacterial infection or co-infection with other viruses.  The expected result is Negative.  Fact Sheet for Patients: SugarRoll.be  Fact Sheet for Healthcare Providers: https://www.woods-mathews.com/  This test is not yet approved or cleared by the [place] FDA and  has been authorized for detection and/or diagnosis of SARS-CoV-2 by FDA under an Emergency Use Authorization (EUA). This EUA will remain  in effect (meaning this test can be used) for the duration of the COVID-19 declaration under Section 564(b)(1) of the Act, 21 U. S.C. section 360bbb-3(b)(1), unless the authorization is terminated or revoked sooner.   Performed at [facility], [place] [address] [place], [place] [address]       Radiology Studies: DG Chest 2 View  Result Date: 02/07/2020 CLINICAL DATA:  POV c/o SHOB and cough. 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 Pt has labored breathing with no exertion and states it started last night. Hx of Diabetes, htn, polycystic kidney disease. Nonsmoker. EXAM: CHEST - 2 VIEW COMPARISON:  01/08/2020 FINDINGS: Patchy hazy airspace lung opacity bilaterally most evident in the right  mid and lower lung consistent with multifocal pneumonia. There is no evidence of pulmonary edema. No pleural effusion and no pneumothorax. Heart is normal in size and configuration. Normal mediastinal and hilar contours. Skeletal structures are unremarkable. IMPRESSION: 1. Bilateral mid and lower lung zone airspace opacities, greater on the right, consistent multifocal pneumonia, pattern compatible with COVID-19 infection. Electronically Signed   By: Lajean Manes M.D.   On: 02/07/2020 14:44     Scheduled Meds: . amLODipine  10 mg Oral Daily  . vitamin C  500 mg Oral Daily  . aspirin EC  81 mg Oral Daily  . dexamethasone  6 mg Oral q1800  . enoxaparin (LOVENOX) injection  50 mg Subcutaneous Q24H  . insulin aspart  0-15 Units Subcutaneous TID WC  . insulin aspart  12 Units Subcutaneous TID WC  . insulin glargine  30 Units Subcutaneous QHS  . sodium chloride flush  3  mL Intravenous Q12H  . zinc sulfate  220 mg Oral Daily   Continuous Infusions: . remdesivir 100 mg in NS 100 mL Stopped (02/08/20 1120)     LOS: 0 days     Enzo Bi, MD [facility] If 7PM-7AM, please contact night-coverage 02/08/2020, 6:17 PM

## 2020-02-08 NOTE — ED Notes (Signed)
Pt up to chair, lunch tray provided.  Pt performing own ADLs will defer VS at this time.

## 2020-02-09 DIAGNOSIS — E875 Hyperkalemia: Secondary | ICD-10-CM

## 2020-02-09 LAB — BASIC METABOLIC PANEL
Anion gap: 11 (ref 5–15)
BUN: 49 mg/dL — ABNORMAL HIGH (ref 6–20)
CO2: 24 mmol/L (ref 22–32)
Calcium: 9.1 mg/dL (ref 8.9–10.3)
Chloride: 101 mmol/L (ref 98–111)
Creatinine, Ser: 2.7 mg/dL — ABNORMAL HIGH (ref 0.61–1.24)
GFR, Estimated: 27 mL/min — ABNORMAL LOW (ref >60)
Glucose, Bld: 245 mg/dL — ABNORMAL HIGH (ref 70–99)
Potassium: 6 mmol/L — ABNORMAL HIGH (ref 3.5–5.1)
Sodium: 136 mmol/L (ref 135–145)

## 2020-02-09 LAB — GLUCOSE, CAPILLARY
Glucose-Capillary: 206 mg/dL — ABNORMAL HIGH (ref 70–99)
Glucose-Capillary: 161 mg/dL — ABNORMAL HIGH (ref 70–99)
Glucose-Capillary: 175 mg/dL — ABNORMAL HIGH (ref 70–99)
Glucose-Capillary: 143 mg/dL — ABNORMAL HIGH (ref 70–99)

## 2020-02-09 LAB — CBC
HCT: 40.6 % (ref 39.0–52.0)
Hemoglobin: 13 g/dL (ref 13.0–17.0)
MCH: 28.7 pg (ref 26.0–34.0)
MCHC: 32 g/dL (ref 30.0–36.0)
MCV: 89.6 fL (ref 80.0–100.0)
Platelets: 296 10*3/uL (ref 150–400)
RBC: 4.53 MIL/uL (ref 4.22–5.81)
RDW: 12.6 % (ref 11.5–15.5)
WBC: 2.7 10*3/uL — ABNORMAL LOW (ref 4.0–10.5)
nRBC: 0 % (ref 0.0–0.2)

## 2020-02-09 LAB — MAGNESIUM: Magnesium: 2.3 mg/dL (ref 1.7–2.4)

## 2020-02-09 LAB — C-REACTIVE PROTEIN: CRP: 3.9 mg/dL — ABNORMAL HIGH (ref <1.0)

## 2020-02-09 MED ORDER — SODIUM ZIRCONIUM CYCLOSILICATE 10 G PO PACK
10.0000 g | PACK | Freq: Two times a day (BID) | ORAL | Status: DC
  Administered 2020-02-09 – 2020-02-10 (×3): 10 g via ORAL
  Filled 2020-02-09 (×4): qty 1

## 2020-02-09 MED ORDER — SODIUM CHLORIDE 0.9 % IV SOLN
INTRAVENOUS | Status: DC | PRN
  Administered 2020-02-09 – 2020-02-10 (×2): 250 mL via INTRAVENOUS

## 2020-02-09 NOTE — Plan of Care (Signed)
  Problem: Education: Goal: Knowledge of risk factors and measures for prevention of condition will improve Outcome: Progressing   

## 2020-02-09 NOTE — Progress Notes (Addendum)
PROGRESS NOTE    Jeffery Hobbs  [id number] DOB: October 27, 1970 DOA: 02/07/2020 PCP: [place]    Assessment & Plan:   Active Problems:   Pneumonia due to COVID-19 virus   Jeffery Hobbs is a 50 y.o. male with medical history significant of  PKD with CKDIII, DMII insulin dependent, PTSD, HTN who presents to ed with  sob cough /fever  Hx of covid exposure.    Acute hypoxic respiratory failure --2/2 COVID PNA Plan: --Continue supplemental O2 to keep sats >=90%, wean as tolerated --treat covid  COVID viral Pneumonia  --started on oral decadron and Remdesivir --CRP 6.9 Plan: --cont Remdesivir --cont oral decadron --ensure CRP down-trending  DMII poorly controlled with hyperglycemia --A1c 10.4 Plan: --cont Lantus 30u nightly --cont mealtime 12u TID --SSI TID --Hold home glipizide   CKD4 Hx of polycystic kidney disease --CKD appeared to have progressived from CKD3b to CKD4 in the past year. --last seen nephrology Dr. Holley Raring on 01/24/20 --cont outpatient followup  HTN --home Losartan was placed on hold due to recent hyperkalemia --cont home amlodipine  Hyperkalemia --start Lokelma --cont to hold home losartan   DVT prophylaxis: Lovenox SQ Code Status: Full code  Family Communication:  Status is: inpatient Dispo:   The patient is from: home Anticipated d/c is to: home Anticipated d/c date is: 1-2 days Patient currently is not medically ready to d/c due to: covid on tx and supplemental O2   Subjective and Interval History:  Pt reported more short of breath when walking, and some coughing.  No chest pain.  No N/V/D.  Normal oral intake.   Objective: Vitals:   02/08/20 2145 02/09/20 0403 02/09/20 0752 02/09/20 1133  BP: (!) 156/93 (!) 141/85 131/82 (!) 145/97  Pulse:  75 79 87  Resp: '20 20 14 16  '$ Temp: 98.6 F (37 C) 97.8 F (36.6 C) 97.8 F (36.6 C) 97.6 F (36.4 C)  TempSrc: Oral Oral Oral Axillary  SpO2: 98% 99% 100% 96%  Weight:       Height:        Intake/Output Summary (Last 24 hours) at 02/09/2020 1541 Last data filed at 02/09/2020 1206 Gross per 24 hour  Intake 360 ml  Output 2050 ml  Net -1690 ml   Filed Weights   02/07/20 1331  Weight: 99.3 kg    Examination:   Constitutional: NAD, AAOx3, sitting at side of bed HEENT: conjunctivae and lids normal, EOMI CV: No cyanosis.   RESP: no distress, on 2L Extremities: No effusions, edema in BLE SKIN: warm, dry Neuro: II - XII grossly intact.   Psych: Normal mood and affect.  Appropriate judgement and reason   Data Reviewed: I have personally reviewed following labs and imaging studies  CBC: Recent Labs  Lab 02/07/20 1336 02/08/20 0159 02/09/20 0451  WBC 6.1 2.9* 2.7*  HGB 13.7 12.8* 13.0  HCT 42.3 40.3 40.6  MCV 89.4 90.4 89.6  PLT 264 242 0000000   Basic Metabolic Panel: Recent Labs  Lab 02/07/20 1336 02/08/20 0159 02/09/20 0451  NA 138  --  136  K 4.6  --  6.0*  CL 102  --  101  CO2 22  --  24  GLUCOSE 120*  --  245*  BUN 36*  --  49*  CREATININE 3.05* 2.79* 2.70*  CALCIUM 8.9  --  9.1  MG  --   --  2.3   GFR: Estimated Creatinine Clearance: 37.2 mL/min (A) (by C-G formula based on SCr of 2.7 mg/dL (  H)). Liver Function Tests: Recent Labs  Lab 02/07/20 1335  AST 26  ALT 24  ALKPHOS 49  BILITOT 0.8  PROT 8.0  ALBUMIN 3.9   No results for input(s): LIPASE, AMYLASE in the last 168 hours. No results for input(s): AMMONIA in the last 168 hours. Coagulation Profile: No results for input(s): INR, PROTIME in the last 168 hours. Cardiac Enzymes: No results for input(s): CKTOTAL, CKMB, CKMBINDEX, TROPONINI in the last 168 hours. BNP (last 3 results) No results for input(s): PROBNP in the last 8760 hours. HbA1C: Recent Labs    02/08/20 0159  HGBA1C 10.4*   CBG: Recent Labs  Lab 02/08/20 1212 02/08/20 1551 02/08/20 2305 02/09/20 0838 02/09/20 1213  GLUCAP 152* 89 197* 206* 161*   Lipid Profile: No results for input(s):  CHOL, HDL, LDLCALC, TRIG, CHOLHDL, LDLDIRECT in the last 72 hours. Thyroid Function Tests: No results for input(s): TSH, T4TOTAL, FREET4, T3FREE, THYROIDAB in the last 72 hours. Anemia Panel: Recent Labs    02/08/20 0159  FERRITIN 574*   Sepsis Labs: No results for input(s): PROCALCITON, LATICACIDVEN in the last 168 hours.  Recent Results (from the past 240 hour(s))  SARS CORONAVIRUS 2 (TAT 6-24 HRS) Nasopharyngeal Nasopharyngeal Swab     Status: Abnormal   Collection Time: 02/08/20  1:59 AM   Specimen: Nasopharyngeal Swab  Result Value Ref Range Status   SARS Coronavirus 2 POSITIVE (A) NEGATIVE Final    Comment: (NOTE) SARS-CoV-2 target nucleic acids are DETECTED.  The SARS-CoV-2 RNA is generally detectable in upper and lower respiratory specimens during the acute phase of infection. Positive results are indicative of the presence of SARS-CoV-2 RNA. Clinical correlation with patient history and other diagnostic information is  necessary to determine patient infection status. Positive results do not rule out bacterial infection or co-infection with other viruses.  The expected result is Negative.  Fact Sheet for Patients: SugarRoll.be  Fact Sheet for Healthcare Providers: https://www.woods-mathews.com/  This test is not yet approved or cleared by the [place] FDA and  has been authorized for detection and/or diagnosis of SARS-CoV-2 by FDA under an Emergency Use Authorization (EUA). This EUA will remain  in effect (meaning this test can be used) for the duration of the COVID-19 declaration under Section 564(b)(1) of the Act, 21 U. S.C. section 360bbb-3(b)(1), unless the authorization is terminated or revoked sooner.   Performed at [facility], [place] [address], [place], [place] [address]       Radiology Studies: No results found.   Scheduled Meds: . amLODipine  10 mg Oral Daily  . vitamin C  500 mg Oral Daily  .  aspirin EC  81 mg Oral Daily  . dexamethasone  6 mg Oral q1800  . enoxaparin (LOVENOX) injection  50 mg Subcutaneous Q24H  . insulin aspart  0-15 Units Subcutaneous TID WC  . insulin glargine  30 Units Subcutaneous QHS  . sodium chloride flush  3 mL Intravenous Q12H  . sodium zirconium cyclosilicate  10 g Oral BID  . zinc sulfate  220 mg Oral Daily   Continuous Infusions: . sodium chloride 250 mL (02/09/20 0900)  . remdesivir 100 mg in NS 100 mL 100 mg (02/09/20 0903)     LOS: 1 day     Enzo Bi, MD [facility] If 7PM-7AM, please contact night-coverage 02/09/2020, 3:41 PM

## 2020-02-10 LAB — C-REACTIVE PROTEIN: CRP: 1.6 mg/dL — ABNORMAL HIGH (ref <1.0)

## 2020-02-10 LAB — MAGNESIUM: Magnesium: 2.2 mg/dL (ref 1.7–2.4)

## 2020-02-10 LAB — BASIC METABOLIC PANEL
Anion gap: 11 (ref 5–15)
BUN: 48 mg/dL — ABNORMAL HIGH (ref 6–20)
CO2: 24 mmol/L (ref 22–32)
Calcium: 9.2 mg/dL (ref 8.9–10.3)
Chloride: 99 mmol/L (ref 98–111)
Creatinine, Ser: 2.44 mg/dL — ABNORMAL HIGH (ref 0.61–1.24)
GFR, Estimated: 31 mL/min — ABNORMAL LOW (ref >60)
Glucose, Bld: 230 mg/dL — ABNORMAL HIGH (ref 70–99)
Potassium: 5.2 mmol/L — ABNORMAL HIGH (ref 3.5–5.1)
Sodium: 134 mmol/L — ABNORMAL LOW (ref 135–145)

## 2020-02-10 LAB — CBC
HCT: 42.1 % (ref 39.0–52.0)
Hemoglobin: 13.9 g/dL (ref 13.0–17.0)
MCH: 29.2 pg (ref 26.0–34.0)
MCHC: 33 g/dL (ref 30.0–36.0)
MCV: 88.4 fL (ref 80.0–100.0)
Platelets: 320 10*3/uL (ref 150–400)
RBC: 4.76 MIL/uL (ref 4.22–5.81)
RDW: 12.6 % (ref 11.5–15.5)
WBC: 3.3 10*3/uL — ABNORMAL LOW (ref 4.0–10.5)
nRBC: 0 % (ref 0.0–0.2)

## 2020-02-10 LAB — GLUCOSE, CAPILLARY
Glucose-Capillary: 199 mg/dL — ABNORMAL HIGH (ref 70–99)
Glucose-Capillary: 202 mg/dL — ABNORMAL HIGH (ref 70–99)

## 2020-02-10 MED ORDER — GUAIFENESIN-CODEINE 100-10 MG/5ML PO SOLN: 5.0000 mL | Freq: Four times a day (QID) | ORAL | 0 refills | Status: DC | PRN

## 2020-02-10 MED ORDER — DEXAMETHASONE 6 MG PO TABS: 6.0000 mg | ORAL_TABLET | Freq: Every day | ORAL | 0 refills | Status: AC

## 2020-02-10 MED ORDER — GUAIFENESIN-DM 100-10 MG/5ML PO SYRP: 10.0000 mL | ORAL_SOLUTION | Freq: Four times a day (QID) | ORAL | Status: DC | PRN

## 2020-02-10 MED ORDER — SODIUM ZIRCONIUM CYCLOSILICATE 10 G PO PACK: 10.0000 g | PACK | Freq: Two times a day (BID) | ORAL | 0 refills | Status: AC

## 2020-02-10 MED ORDER — LOSARTAN POTASSIUM 50 MG PO TABS: ORAL_TABLET | ORAL | Status: DC

## 2020-02-10 NOTE — Progress Notes (Signed)
Patient is being discharged home.  Discharge papers given and explained to pt.  Pt verbalized understanding.  Meds and f/u appointments reviewed.  Rx sent electonically to pharmacy.  Pt made aware. Awaiting transportation.

## 2020-02-10 NOTE — Progress Notes (Signed)
SATURATION QUALIFICATIONS: (This note is used to comply with regulatory documentation for home oxygen)  Patient Saturations on Room Air at Rest = 93%  Patient Saturations on Room Air while Ambulating = 92%  Patient Saturations on 0 Liters of oxygen while Ambulating = 92%  Please briefly explain why patient needs home oxygen:

## 2020-02-10 NOTE — Discharge Summary (Signed)
Physician Discharge Summary   Jeffery Hobbs  male DOB: 11-11-1970  [id number]  PCP: [facility] date: 02/07/2020 Discharge date: 02/10/2020  Admitted From: home Disposition:  home CODE STATUS: Full code  Discharge Instructions    Diet - low sodium heart healthy   Complete by: As directed    Discharge instructions   Complete by: As directed    You have COVID infection, but a mild case, and since you no longer need supplemental oxygen, you can go home to recover.   Dr. Enzo Bi - -   Increase activity slowly   Complete by: As directed        Hospital Course:  For full details, please see H&P, progress notes, consult notes and ancillary notes.  Briefly,  Jeffery Hobbs a 50 y.o.malewith medical history significant of PKD with CKDIII, DMII insulin dependent, PTSD, HTN who presented to ed with sob cough fever. Hx of covid exposure.    Acute hypoxic respiratory failure 2/2 COVID PNA.  O2 sats noted to drop to 88% on room air on admission, so was placed on 2L [place].  Prior to discharge, pt did not need any more supplemental oxygen.  COVID viral Pneumonia  Pt was started on oral decadron and Remdesivir.  CRP 6.9 on presentation, trended down to 1.6 prior to discharge.  Pt was discharged on 7 more days of decadron to ensure inflammation stays low.  DMII poorly controlled with hyperglycemia A1c 10.4.  Pt received Lantus 30u nightly, mealtime 12u TID and SSI TID while inpatient.  Home glipizide held.  Pt was discharged on home diabetic regimen.  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  Last seen nephrology Dr. Holley Raring on 01/24/20.  Pt will continue outpatient followup with nephrology.  HTN home Losartan was placed on hold PTA due to recent hyperkalemia.  Continued home amlodipine.  Hyperkalemia home Losartan was placed on hold PTA due to recent hyperkalemia.  Pt was started on Lokelma,  and was discharged on 5 more days of 10g BID.   Discharge Diagnoses:  Active Problems:   Pneumonia due to COVID-19 virus    Discharge Instructions:  Allergies as of 02/10/2020   No Known Allergies     Medication List    STOP taking these medications   insulin lispro 100 UNIT/ML KwikPen Commonly known as: HUMALOG     TAKE these medications   amLODipine 10 MG tablet Commonly known as: NORVASC Take 10 mg by mouth daily.   aspirin EC 81 MG tablet Take 81 mg by mouth daily.   atorvastatin 10 MG tablet Commonly known as: LIPITOR Take 10 mg by mouth daily.   cholecalciferol 25 MCG (1000 UNIT) tablet Commonly known as: VITAMIN D3 Take 1,000 Units by mouth daily.   dexamethasone 6 MG tablet Commonly known as: DECADRON Take 1 tablet (6 mg total) by mouth daily for 7 days. To help reduce inflammation.   EPINEPHrine 0.3 mg/0.3 mL Soaj injection Commonly known as: EPI-PEN Inject 0.3 mLs into the muscle once as needed for anaphylaxis.   glipiZIDE 10 MG tablet Commonly known as: GLUCOTROL Take 10 mg by mouth daily before breakfast.   guaiFENesin-codeine 100-10 MG/5ML syrup Take 5 mLs by mouth every 6 (six) hours as needed for cough.   Lantus SoloStar 100 UNIT/ML Solostar Pen Generic drug: insulin glargine Inject 30 Units into the skin at bedtime.   losartan 50 MG tablet Commonly known  as: COZAAR Hold until followup with nephrologist. What changed:   how much to take  how to take this  when to take this  additional instructions   sodium zirconium cyclosilicate 10 g Pack packet Commonly known as: LOKELMA Take 10 g by mouth 2 (two) times daily for 5 days. To lower your potassium.        Follow-up Information    Schedule an appointment as soon as possible for a visit  with [facility] information: [place] [address] [phone]        Anthonette Legato, MD. Schedule an appointment as soon as possible for a  visit in 1 week(s).   Specialty: Nephrology Contact information: [place] [address] [phone]               No Known Allergies   The results of significant diagnostics from this hospitalization (including imaging, microbiology, ancillary and laboratory) are listed below for reference.   Consultations:   Procedures/Studies: DG Chest 2 View  Result Date: 02/07/2020 CLINICAL DATA:  POV c/o SHOB and cough. 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 Pt has labored breathing with no exertion and states it started last night. Hx of Diabetes, htn, polycystic kidney disease. Nonsmoker. EXAM: CHEST - 2 VIEW COMPARISON:  01/08/2020 FINDINGS: Patchy hazy airspace lung opacity bilaterally most evident in the right mid and lower lung consistent with multifocal pneumonia. There is no evidence of pulmonary edema. No pleural effusion and no pneumothorax. Heart is normal in size and configuration. Normal mediastinal and hilar contours. Skeletal structures are unremarkable. IMPRESSION: 1. Bilateral mid and lower lung zone airspace opacities, greater on the right, consistent multifocal pneumonia, pattern compatible with COVID-19 infection. Electronically Signed   By: Lajean Manes M.D.   On: 02/07/2020 14:44      Labs: BNP (last 3 results) No results for input(s): BNP in the last 8760 hours. Basic Metabolic Panel: Recent Labs  Lab 02/07/20 1336 02/08/20 0159 02/09/20 0451 02/10/20 0700  NA 138  --  136 134*  K 4.6  --  6.0* 5.2*  CL 102  --  101 99  CO2 22  --  24 24  GLUCOSE 120*  --  245* 230*  BUN 36*  --  49* 48*  CREATININE 3.05* 2.79* 2.70* 2.44*  CALCIUM 8.9  --  9.1 9.2  MG  --   --  2.3 2.2   Liver Function Tests: Recent Labs  Lab 02/07/20 1335  AST 26  ALT 24  ALKPHOS 49  BILITOT 0.8  PROT 8.0  ALBUMIN 3.9   No results for input(s): LIPASE, AMYLASE  in the last 168 hours. No results for input(s): AMMONIA in the last 168 hours. CBC: Recent Labs  Lab 02/07/20 1336 02/08/20 0159 02/09/20 0451 02/10/20 0700  WBC 6.1 2.9* 2.7* 3.3*  HGB 13.7 12.8* 13.0 13.9  HCT 42.3 40.3 40.6 42.1  MCV 89.4 90.4 89.6 88.4  PLT 264 242 296 320   Cardiac Enzymes: No results for input(s): CKTOTAL, CKMB, CKMBINDEX, TROPONINI in the last 168 hours. BNP: Invalid input(s): POCBNP CBG: Recent Labs  Lab 02/09/20 1213 02/09/20 1621 02/09/20 1952 02/10/20 0803 02/10/20 1147  GLUCAP 161* 175* 143* 199* 202*   D-Dimer No results for input(s): DDIMER in the last 72 hours. Hgb A1c Recent Labs    02/08/20  0159  HGBA1C 10.4*   Lipid Profile No results for input(s): CHOL, HDL, LDLCALC, TRIG, CHOLHDL, LDLDIRECT in the last 72 hours. Thyroid function studies No results for input(s): TSH, T4TOTAL, T3FREE, THYROIDAB in the last 72 hours.  Invalid input(s): FREET3 Anemia work up Recent Labs    02/08/20 0159  FERRITIN 574*   Urinalysis    Component Value Date/Time   COLORURINE STRAW (A) 01/10/2020 0831   APPEARANCEUR CLEAR (A) 01/10/2020 0831   LABSPEC 1.014 01/10/2020 0831   PHURINE 5.0 01/10/2020 0831   GLUCOSEU >=500 (A) 01/10/2020 0831   HGBUR SMALL (A) 01/10/2020 0831   BILIRUBINUR NEGATIVE 01/10/2020 0831   BILIRUBINUR negative 04/13/2019 1205   KETONESUR NEGATIVE 01/10/2020 0831   PROTEINUR 30 (A) 01/10/2020 0831   UROBILINOGEN 0.2 04/13/2019 1205   NITRITE NEGATIVE 01/10/2020 0831   LEUKOCYTESUR NEGATIVE 01/10/2020 0831   Sepsis Labs Invalid input(s): PROCALCITONIN,  WBC,  LACTICIDVEN Microbiology Recent Results (from the past 240 hour(s))  SARS CORONAVIRUS 2 (TAT 6-24 HRS) Nasopharyngeal Nasopharyngeal Swab     Status: Abnormal   Collection Time: 02/08/20  1:59 AM   Specimen: Nasopharyngeal Swab  Result Value Ref Range Status   SARS Coronavirus 2 POSITIVE (A) NEGATIVE Final    Comment: (NOTE) SARS-CoV-2 target nucleic  acids are DETECTED.  The SARS-CoV-2 RNA is generally detectable in upper and lower respiratory specimens during the acute phase of infection. Positive results are indicative of the presence of SARS-CoV-2 RNA. Clinical correlation with patient history and other diagnostic information is  necessary to determine patient infection status. Positive results do not rule out bacterial infection or co-infection with other viruses.  The expected result is Negative.  Fact Sheet for Patients: SugarRoll.be  Fact Sheet for Healthcare Providers: https://www.woods-mathews.com/  This test is not yet approved or cleared by the [place] FDA and  has been authorized for detection and/or diagnosis of SARS-CoV-2 by FDA under an Emergency Use Authorization (EUA). This EUA will remain  in effect (meaning this test can be used) for the duration of the COVID-19 declaration under Section 564(b)(1) of the Act, 21 U. S.C. section 360bbb-3(b)(1), unless the authorization is terminated or revoked sooner.   Performed at [facility], [place] [address], [place], [place] [address]      Total time spend on discharging this patient, including the last patient exam, discussing the hospital stay, instructions for ongoing care as it relates to all pertinent caregivers, as well as preparing the medical discharge records, prescriptions, and/or referrals as applicable, is 40 minutes.    Enzo Bi, MD  [facility] 02/10/2020, 1:34 PM

## 2020-03-05 LAB — BLOOD GAS, VENOUS
Acid-base deficit: 3.8 mmol/L — ABNORMAL HIGH (ref 0.0–2.0)
Bicarbonate: 23.5 mmol/L (ref 20.0–28.0)
O2 Saturation: 45.6 %
Patient temperature: 37
pCO2, Ven: 50 mmHg (ref 44.0–60.0)
pH, Ven: 7.28 (ref 7.250–7.430)

## 2020-03-20 ENCOUNTER — Other Ambulatory Visit: Payer: Self-pay | Admitting: Orthopedic Surgery

## 2020-03-21 ENCOUNTER — Other Ambulatory Visit: Admission: RE | Admit: 2020-03-21 | Payer: BC Managed Care – PPO | Source: Ambulatory Visit

## 2020-03-21 ENCOUNTER — Encounter
Admission: RE | Admit: 2020-03-21 | Discharge: 2020-03-21 | Disposition: A | Discharge status: Home / Self Care | Payer: BC Managed Care – PPO | Source: Ambulatory Visit | Attending: Orthopedic Surgery | Admitting: Orthopedic Surgery

## 2020-03-21 ENCOUNTER — Other Ambulatory Visit: Payer: Self-pay

## 2020-03-21 NOTE — Patient Instructions (Signed)
Your procedure is scheduled on: Tuesday March 25, 2020 Report to the Registration Desk on the 1st floor of the [facility]. To find out your arrival time, please call [phone] between 1PM - 3PM on: Monday March 24, 2020  REMEMBER: Instructions that are not followed completely may result in serious medical risk, up to and including death; or upon the discretion of your surgeon and anesthesiologist your surgery may need to be rescheduled.  Do not eat food after midnight the night before surgery.  No gum chewing, lozengers or hard candies.  You may however, drink CLEAR liquids up to 2 hours before you are scheduled to arrive for your surgery. Do not drink anything within 2 hours of your scheduled arrival time.  Clear liquids include: - water   Do NOT drink anything that is not on this list.  Type 1 and Type 2 diabetics should only drink water.    TAKE THESE MEDICATIONS THE MORNING OF SURGERY WITH A SIP OF WATER: AMLODIPINE  Use inhalers on the day of surgery   Take 1/2 of usual insulin dose the night before surgery and none on the morning of surgery.  One week prior to surgery: Stop Anti-inflammatories (NSAIDS) such as Advil, Aleve, Ibuprofen, Motrin, Naproxen, Naprosyn and ASPIRIN OR Aspirin based products such as Excedrin, Goodys Powder, BC Powder. Stop ANY OVER THE COUNTER supplements until after surgery.  No Alcohol for 24 hours before or after surgery.  No Smoking including e-cigarettes for 24 hours prior to surgery.  No chewable tobacco products for at least 6 hours prior to surgery.  No nicotine patches on the day of surgery.  Do not use any "recreational" drugs for at least a week prior to your surgery.  Please be advised that the combination of cocaine and anesthesia may have negative outcomes, up to and including death. If you test positive for cocaine, your surgery will be cancelled.  On the morning of surgery brush your teeth with toothpaste and water, you may  rinse your mouth with mouthwash if you wish. Do not swallow any toothpaste or mouthwash.  Do not wear jewelry, make-up, hairpins, clips or nail polish.  Do not wear lotions, powders, or perfumes.   Do not shave body from the neck down 48 hours prior to surgery just in case you cut yourself which could leave a site for infection.  Also, freshly shaved skin may become irritated if using the CHG soap.  Contact lenses, hearing aids and dentures may not be worn into surgery.  Do not bring valuables to the hospital. [facility] is not responsible for any missing/lost belongings or valuables.   Use CHG Soap as directed on instruction sheet.  IF YOU HAVE A CPAP - Bring your C-PAP to the hospital with you in case you may have to spend the night.   Notify your doctor if there is any change in your medical condition (cold, fever, infection).  Wear comfortable clothing (specific to your surgery type) to the hospital.  Plan for stool softeners for home use; pain medications have a tendency to cause constipation. You can also help prevent constipation by eating foods high in fiber such as fruits and vegetables and drinking plenty of fluids as your diet allows.  After surgery, you can help prevent lung complications by doing breathing exercises.  Take deep breaths and cough every 1-2 hours. Your doctor may order a device called an Incentive Spirometer to help you take deep breaths. When coughing or sneezing, hold a  pillow firmly against your incision with both hands. This is called "splinting." Doing this helps protect your incision. It also decreases belly discomfort.  If you are being discharged the day of surgery, you will not be allowed to drive home. You will need a responsible adult (18 years or older) to drive you home and stay with you that night.   If you are taking public transportation, you will need to have a responsible adult (18 years or older) with you. Please confirm with your  physician that it is acceptable to use public transportation.   Please call the [facility] at [phone] if you have any questions about these instructions.  Surgery Visitation Policy:  Patients undergoing a surgery or procedure may have one family member or support person with them as long as that person is not COVID-19 positive or experiencing its symptoms.  That person may remain in the waiting area during the procedure.  Inpatient Visitation:    Visiting hours are 7 a.m. to 8 p.m. Inpatients will be allowed two visitors daily. The visitors may change each day during the patient's stay. No visitors under the age of 39. Any visitor under the age of 24 must be accompanied by an adult. The visitor must pass COVID-19 screenings, use hand sanitizer when entering and exiting the patient's room and wear a mask at all times, including in the patient's room. Patients must also wear a mask when staff or their visitor are in the room. Masking is required regardless of vaccination status.

## 2020-03-25 ENCOUNTER — Ambulatory Visit: Payer: BC Managed Care – PPO | Admitting: Certified Registered Nurse Anesthetist

## 2020-03-25 ENCOUNTER — Ambulatory Visit
Admission: RE | Admit: 2020-03-25 | Discharge: 2020-03-25 | Disposition: A | Discharge status: Home / Self Care | Payer: BC Managed Care – PPO | Attending: Orthopedic Surgery | Admitting: Orthopedic Surgery

## 2020-03-25 ENCOUNTER — Other Ambulatory Visit: Payer: Self-pay

## 2020-03-25 ENCOUNTER — Encounter
Admission: RE | Disposition: A | Discharge status: Home / Self Care | Payer: Self-pay | Source: Home / Self Care | Attending: Orthopedic Surgery

## 2020-03-25 ENCOUNTER — Encounter: Payer: Self-pay | Admitting: Orthopedic Surgery

## 2020-03-25 DIAGNOSIS — R2232 Localized swelling, mass and lump, left upper limb: Secondary | ICD-10-CM | POA: Diagnosis present

## 2020-03-25 DIAGNOSIS — Z7984 Long term (current) use of oral hypoglycemic drugs: Secondary | ICD-10-CM | POA: Insufficient documentation

## 2020-03-25 DIAGNOSIS — Z794 Long term (current) use of insulin: Secondary | ICD-10-CM | POA: Diagnosis not present

## 2020-03-25 DIAGNOSIS — Z7982 Long term (current) use of aspirin: Secondary | ICD-10-CM | POA: Diagnosis not present

## 2020-03-25 DIAGNOSIS — Z79899 Other long term (current) drug therapy: Secondary | ICD-10-CM | POA: Insufficient documentation

## 2020-03-25 DIAGNOSIS — Q613 Polycystic kidney, unspecified: Secondary | ICD-10-CM | POA: Insufficient documentation

## 2020-03-25 DIAGNOSIS — L980 Pyogenic granuloma: Secondary | ICD-10-CM | POA: Insufficient documentation

## 2020-03-25 HISTORY — PX: GANGLION CYST EXCISION: SHX1691

## 2020-03-25 LAB — POCT I-STAT, CHEM 8
BUN: 40 mg/dL — ABNORMAL HIGH (ref 6–20)
BUN: 30 mg/dL — ABNORMAL HIGH (ref 6–20)
Calcium, Ion: 0.85 mmol/L — CL (ref 1.15–1.40)
Calcium, Ion: 1.43 mmol/L — ABNORMAL HIGH (ref 1.15–1.40)
Chloride: 113 mmol/L — ABNORMAL HIGH (ref 98–111)
Chloride: 105 mmol/L (ref 98–111)
Creatinine, Ser: 2.4 mg/dL — ABNORMAL HIGH (ref 0.61–1.24)
Creatinine, Ser: 2.3 mg/dL — ABNORMAL HIGH (ref 0.61–1.24)
Glucose, Bld: 94 mg/dL (ref 70–99)
Glucose, Bld: 92 mg/dL (ref 70–99)
HCT: 47 % (ref 39.0–52.0)
HCT: 43 % (ref 39.0–52.0)
Hemoglobin: 16 g/dL (ref 13.0–17.0)
Hemoglobin: 14.6 g/dL (ref 13.0–17.0)
Potassium: 5.4 mmol/L — ABNORMAL HIGH (ref 3.5–5.1)
Potassium: 4.5 mmol/L (ref 3.5–5.1)
Sodium: 133 mmol/L — ABNORMAL LOW (ref 135–145)
Sodium: 141 mmol/L (ref 135–145)
TCO2: 18 mmol/L — ABNORMAL LOW (ref 22–32)
TCO2: 21 mmol/L — ABNORMAL LOW (ref 22–32)

## 2020-03-25 SURGERY — EXCISION, GANGLION CYST, WRIST
Anesthesia: General | Site: Wrist | Laterality: Left

## 2020-03-25 MED ORDER — METOCLOPRAMIDE HCL 5 MG/ML IJ SOLN: 5.0000 mg | Freq: Three times a day (TID) | INTRAMUSCULAR | Status: DC | PRN

## 2020-03-25 MED ORDER — FAMOTIDINE 20 MG PO TABS: 20.0000 mg | ORAL_TABLET | Freq: Once | ORAL | Status: AC

## 2020-03-25 MED ORDER — PROPOFOL 10 MG/ML IV BOLUS
INTRAVENOUS | Status: AC
  Filled 2020-03-25: qty 40

## 2020-03-25 MED ORDER — FENTANYL CITRATE (PF) 100 MCG/2ML IJ SOLN
INTRAMUSCULAR | Status: DC | PRN
  Administered 2020-03-25 (×2): 25 ug via INTRAVENOUS
  Administered 2020-03-25: 50 ug via INTRAVENOUS

## 2020-03-25 MED ORDER — BUPIVACAINE HCL 0.5 % IJ SOLN
INTRAMUSCULAR | Status: DC | PRN
  Administered 2020-03-25: 10 mL

## 2020-03-25 MED ORDER — FENTANYL CITRATE (PF) 100 MCG/2ML IJ SOLN
INTRAMUSCULAR | Status: AC
  Filled 2020-03-25: qty 2

## 2020-03-25 MED ORDER — ONDANSETRON HCL 4 MG PO TABS: 4.0000 mg | ORAL_TABLET | Freq: Four times a day (QID) | ORAL | Status: DC | PRN

## 2020-03-25 MED ORDER — DEXMEDETOMIDINE (PRECEDEX) IN NS 20 MCG/5ML (4 MCG/ML) IV SYRINGE
PREFILLED_SYRINGE | INTRAVENOUS | Status: AC
  Filled 2020-03-25: qty 5

## 2020-03-25 MED ORDER — ONDANSETRON HCL 4 MG/2ML IJ SOLN: 4.0000 mg | Freq: Four times a day (QID) | INTRAMUSCULAR | Status: DC | PRN

## 2020-03-25 MED ORDER — DEXMEDETOMIDINE HCL 200 MCG/2ML IV SOLN
INTRAVENOUS | Status: DC | PRN
  Administered 2020-03-25 (×2): 8 ug via INTRAVENOUS
  Administered 2020-03-25: 4 ug via INTRAVENOUS

## 2020-03-25 MED ORDER — ONDANSETRON HCL 4 MG/2ML IJ SOLN
INTRAMUSCULAR | Status: AC
  Filled 2020-03-25: qty 2

## 2020-03-25 MED ORDER — HYDROCODONE-ACETAMINOPHEN 5-325 MG PO TABS
ORAL_TABLET | ORAL | Status: AC
  Filled 2020-03-25: qty 1

## 2020-03-25 MED ORDER — ONDANSETRON HCL 4 MG/2ML IJ SOLN
INTRAMUSCULAR | Status: DC | PRN
  Administered 2020-03-25: 4 mg via INTRAVENOUS

## 2020-03-25 MED ORDER — PHENYLEPHRINE HCL (PRESSORS) 10 MG/ML IV SOLN
INTRAVENOUS | Status: DC | PRN
  Administered 2020-03-25 (×2): 100 ug via INTRAVENOUS

## 2020-03-25 MED ORDER — CEFAZOLIN SODIUM-DEXTROSE 2-4 GM/100ML-% IV SOLN
INTRAVENOUS | Status: AC
  Filled 2020-03-25: qty 100

## 2020-03-25 MED ORDER — GLYCOPYRROLATE 0.2 MG/ML IJ SOLN
INTRAMUSCULAR | Status: DC | PRN
  Administered 2020-03-25: .2 mg via INTRAVENOUS

## 2020-03-25 MED ORDER — PROPOFOL 10 MG/ML IV BOLUS
INTRAVENOUS | Status: DC | PRN
  Administered 2020-03-25: 200 mg via INTRAVENOUS

## 2020-03-25 MED ORDER — BUPIVACAINE HCL (PF) 0.5 % IJ SOLN
INTRAMUSCULAR | Status: AC
  Filled 2020-03-25: qty 30

## 2020-03-25 MED ORDER — MIDAZOLAM HCL 2 MG/2ML IJ SOLN
INTRAMUSCULAR | Status: AC
  Filled 2020-03-25: qty 2

## 2020-03-25 MED ORDER — DEXAMETHASONE SODIUM PHOSPHATE 10 MG/ML IJ SOLN
INTRAMUSCULAR | Status: DC | PRN
  Administered 2020-03-25: 10 mg via INTRAVENOUS

## 2020-03-25 MED ORDER — OXYCODONE HCL 5 MG/5ML PO SOLN: 5.0000 mg | Freq: Once | ORAL | Status: DC | PRN

## 2020-03-25 MED ORDER — LIDOCAINE HCL (CARDIAC) PF 100 MG/5ML IV SOSY
PREFILLED_SYRINGE | INTRAVENOUS | Status: DC | PRN
  Administered 2020-03-25: 100 mg via INTRAVENOUS

## 2020-03-25 MED ORDER — CHLORHEXIDINE GLUCONATE 0.12 % MT SOLN: 15.0000 mL | Freq: Once | OROMUCOSAL | Status: AC

## 2020-03-25 MED ORDER — CEFAZOLIN SODIUM-DEXTROSE 2-4 GM/100ML-% IV SOLN
2.0000 g | INTRAVENOUS | Status: AC
  Administered 2020-03-25: 2 g via INTRAVENOUS

## 2020-03-25 MED ORDER — SODIUM CHLORIDE 0.9 % IV SOLN: INTRAVENOUS | Status: DC

## 2020-03-25 MED ORDER — DEXAMETHASONE SODIUM PHOSPHATE 10 MG/ML IJ SOLN
INTRAMUSCULAR | Status: AC
  Filled 2020-03-25: qty 1

## 2020-03-25 MED ORDER — OXYCODONE HCL 5 MG PO TABS: 5.0000 mg | ORAL_TABLET | Freq: Once | ORAL | Status: DC | PRN

## 2020-03-25 MED ORDER — CHLORHEXIDINE GLUCONATE 0.12 % MT SOLN
OROMUCOSAL | Status: AC
  Administered 2020-03-25: 15 mL via OROMUCOSAL
  Filled 2020-03-25: qty 15

## 2020-03-25 MED ORDER — FAMOTIDINE 20 MG PO TABS
ORAL_TABLET | ORAL | Status: AC
  Administered 2020-03-25: 20 mg via ORAL
  Filled 2020-03-25: qty 1

## 2020-03-25 MED ORDER — HYDROCODONE-ACETAMINOPHEN 5-325 MG PO TABS: 1.0000 | ORAL_TABLET | Freq: Four times a day (QID) | ORAL | 0 refills | Status: DC | PRN

## 2020-03-25 MED ORDER — ORAL CARE MOUTH RINSE: 15.0000 mL | Freq: Once | OROMUCOSAL | Status: AC

## 2020-03-25 MED ORDER — FENTANYL CITRATE (PF) 100 MCG/2ML IJ SOLN
25.0000 ug | INTRAMUSCULAR | Status: DC | PRN
  Administered 2020-03-25: 25 ug via INTRAVENOUS
  Administered 2020-03-25: 50 ug via INTRAVENOUS
  Administered 2020-03-25: 25 ug via INTRAVENOUS

## 2020-03-25 MED ORDER — PROPOFOL 10 MG/ML IV BOLUS
INTRAVENOUS | Status: AC
  Filled 2020-03-25: qty 20

## 2020-03-25 MED ORDER — METOCLOPRAMIDE HCL 10 MG PO TABS: 5.0000 mg | ORAL_TABLET | Freq: Three times a day (TID) | ORAL | Status: DC | PRN

## 2020-03-25 MED ORDER — HYDROCODONE-ACETAMINOPHEN 5-325 MG PO TABS
1.0000 | ORAL_TABLET | Freq: Once | ORAL | Status: AC
  Administered 2020-03-25: 1 via ORAL

## 2020-03-25 MED ORDER — MIDAZOLAM HCL 2 MG/2ML IJ SOLN
INTRAMUSCULAR | Status: DC | PRN
  Administered 2020-03-25: 2 mg via INTRAVENOUS

## 2020-03-25 SURGICAL SUPPLY — 31 items
APL PRP STRL LF DISP 70% ISPRP (MISCELLANEOUS) ×1
BNDG CMPR STD VLCR NS LF 5.8X3 (GAUZE/BANDAGES/DRESSINGS) ×1
BNDG ELASTIC 3X5.8 VLCR NS LF (GAUZE/BANDAGES/DRESSINGS) ×2 IMPLANT
CAST PADDING 3X4FT ST 30246 (SOFTGOODS) ×1
CHLORAPREP W/TINT 26 (MISCELLANEOUS) ×2 IMPLANT
COVER WAND RF STERILE (DRAPES) ×2 IMPLANT
CUFF TOURN SGL QUICK 18X4 (TOURNIQUET CUFF) ×2 IMPLANT
ELECT CAUTERY NEEDLE 2.0 MIC (NEEDLE) ×2 IMPLANT
GAUZE SPONGE 4X4 12PLY STRL (GAUZE/BANDAGES/DRESSINGS) ×2 IMPLANT
GAUZE XEROFORM 1X8 LF (GAUZE/BANDAGES/DRESSINGS) ×2 IMPLANT
GLOVE SURG SYN 9.0  PF PI (GLOVE) ×1
GLOVE SURG SYN 9.0 PF PI (GLOVE) ×1 IMPLANT
GOWN SRG 2XL LVL 4 RGLN SLV (GOWNS) ×1 IMPLANT
GOWN STRL NON-REIN 2XL LVL4 (GOWNS) ×2
GOWN STRL REUS W/ TWL LRG LVL3 (GOWN DISPOSABLE) ×1 IMPLANT
GOWN STRL REUS W/TWL LRG LVL3 (GOWN DISPOSABLE) ×2
KIT TURNOVER KIT A (KITS) ×2 IMPLANT
MANIFOLD NEPTUNE II (INSTRUMENTS) ×2 IMPLANT
NS IRRIG 500ML POUR BTL (IV SOLUTION) ×2 IMPLANT
PACK EXTREMITY ARMC (MISCELLANEOUS) ×2 IMPLANT
PAD CAST CTTN 3X4 STRL (SOFTGOODS) ×1 IMPLANT
PADDING CAST COTTON 3X4 STRL (SOFTGOODS) ×1
SCALPEL PROTECTED #15 DISP (BLADE) ×4 IMPLANT
SPLINT CAST 1 STEP 3X12 (MISCELLANEOUS) IMPLANT
SUT ETHILON 4-0 (SUTURE) ×2
SUT ETHILON 4-0 FS2 18XMFL BLK (SUTURE) ×1
SUT ETHILON 5-0 FS-2 18 BLK (SUTURE) ×2 IMPLANT
SUT MNCRL 4-0 (SUTURE) ×2
SUT MNCRL 4-018XMFL (SUTURE) ×1
SUTURE ETHLN 4-0 FS2 18XMF BLK (SUTURE) ×1 IMPLANT
SUTURE MNCRL 4-018XMF (SUTURE) ×1 IMPLANT

## 2020-03-25 NOTE — Transfer of Care (Signed)
Immediate Anesthesia Transfer of Care Note  Patient: Jeffery Hobbs  Procedure(s) Performed: left, ring finger mass excision (Left Wrist)  Patient Location: PACU  Anesthesia Type:General  Level of Consciousness: sedated  Airway & Oxygen Therapy: Patient Spontanous Breathing and Patient connected to face mask oxygen  Post-op Assessment: Report given to RN and Post -op Vital signs reviewed and stable  Post vital signs: Reviewed and stable  Last Vitals:  Vitals Value Taken Time  BP 103/63 03/25/20 1536  Temp 36.8 C 03/25/20 1536  Pulse 90 03/25/20 1536  Resp 16 03/25/20 1536  SpO2 98 % 03/25/20 1536  Vitals shown include unvalidated device data.  Last Pain:  Vitals:   03/25/20 1318  TempSrc: Tympanic  PainSc: 6       Patients Stated Pain Goal: 0 ([id number])  Complications: No complications documented.

## 2020-03-25 NOTE — H&P (Signed)
Jeffery Reusing, MD - 03/12/2020 8:45 AM EST Formatting of this note is different from the original. [place] EVALUATION  Chief Complaint: No chief complaint on file.  04/30/19: PROCEDURES:  1. Left Triceps Repair  History of Present Illness: 03/12/2020: At his last visit with me approximately 7 weeks ago, he received a corticosteroid injection to the right knee and was found to have likely medial epicondylitis of the right elbow. He states that both the right elbow and right knee are improved.  He presents today for evaluation of his left ring finger. He states that approximately 4 months ago he noted a break in his skin like a blister due to using his pistol at work. He states that it kept healing and getting irritated as he has had constant friction in this area. However, over the past 2-3 weeks, he noted that the skin covering peeled off and there was a focal mass that pouched out from this region. He states that it has frequently been bleeding from this place. It is quite tender about the base of the mass.  01/23/20: Returns today for a corticosteroid injection to the right knee. He had prior corticosteroid injection on 08/08/2019 and had complete resolution of his symptoms with this injection. He had good relief for ~3 months before symptoms started to return.  Additionally, he is having left elbow pain. He states that symptoms began [place] 12[place] airport [place] cargo into an aircraft and felt a sharp pain and a popping sensation. He has had difficulty working since this has occurred. Symptoms improved with rest and worsened with any sort of pulling or lifting activity. Pain is primarily located along the medial aspect of the elbow.   10/17/19: He is now approximately 5.5 months after undergoing left triceps repair. He reports he has returned to full function with his left upper extremity. 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 He notes no problems with his left elbow other than some occasional sensations while driving. These quickly resolve. He is also return to all activities that he wishes to perform without limitation.  Of note, he also received a corticosteroid injection to his right knee at his last visit with me on 08/08/2019. He reports essentially complete resolution of his symptoms on the right knee after this injection.  08/08/19: He is here today for evaluation of his right knee. He has had prior corticosteroid injections to the knee and these last approximately 4 to 6 months. He would like repeat injection. Last injection was in March before he moved here to [place]. He has not had any prior surgery on the knee. Pain is located primarily over the medial aspect. He notes he is walking with a limp. He has significant stiffness, especially in the mornings and after he has been seated for prolonged period of time.  Regarding his left triceps, he notes a difference in his strength. He feels he is making significant improvements with therapy. He has some mild pain when he pushes off and is driving, but this is improving as well.  07/18/19: He is now approximately 12 weeks after undergoing left triceps repair. He has been making appropriate progress with physical therapy. He he feels like his range of motion is essentially normalized. He feels some mild tightness type sensations. He has avoided pushing off or significant weightbearing through the elbow. He did not have any significant pain.  06/06/19: Is now approximately  6 weeks after undergoing left triceps repair. Is not having any significant pain at this point in time. He has been progressing appropriately with physical therapy.  05/10/19: He is now approximately 2 weeks after undergoing above procedure. He reports pain is appropriately controlled. He has been compliant with nonweightbearing status and maintaining splint.  04/25/19: Romere Gardy is a 50  y.o. male referred by Self for L elbow evaluation and management.  Prior medical records were reviewed. The patient was initially evaluated by Dr. Candelaria Stagers on 04/23/19. From his note:  "His pain began with an acute injury on 04/17/2019. He reports that he has been moving from [place] to [place] over the past 3 months which includes flying back and forth and packing boxes for his house. He reports that he was doing a lot of lifting of boxes the day of his injury and started to notice some tightness in his left posterior elbow. At one point, he reached out to give his wife a hug when he felt a sharp pain over his posterior/medial elbow. He has significant swelling and bruising so he went to the local emergency room. They did not do much for him so he saw his local orthopedist who recommended MRI. Due to his move, the local orthopedist did not think he should have treatment in [place] so he was taken out of work until 05/04/2019 and recommended follow-up in [place]. The pain is located over his left posterior elbow. He denies any radiation of symptoms into his hand. He describes his pain as intermittent and shooting. It is aggravated by elbow flexion. He currently rates pain severity as a 10/10. He reports associated swelling, bruising, skin color change, pain at night. He denies associated elbow locking/catching, elbow instability, numbness or tingling, weakness, fevers or chills, night sweats, weight loss. He has tried Tylenol, muscle relaxers, sling with persistent symptoms.  He is right hand dominant and works as a Designer, industrial/product. He is formally in the [employer]  He stays active with traveling, shooting, home exercise including running and push-ups at a moderate intensity. He feels significant limitations to his activities due to his symptoms."  He currently rates pain severity as a 5/10. His symptoms began as described above approximately 1 week ago. Pain is located over the posterior  aspect of the elbow. He describes it as a constant, sharp, shooting sensation with some burning as well. He has polycystic kidney disease and tries to avoid NSAIDs if possible.   PMHx, PSurgHx, Fam Hx, Soc Hx, Meds, Allergies: Past Medical History:  Diagnosis Date  . Anxiety  . Chronic kidney disease  . CKD (chronic kidney disease) stage 4, GFR 15-29 ml/min (CMS-HCC) 06/26/2019  29 gfr in [place] 5-21  . Diabetes mellitus type 2 with complications (CMS-HCC) A999333  . Diabetes mellitus without complication (CMS-HCC)  . Essential hypertension 06/26/2019  . Hypertension  . Polycystic kidney disease  . PTSD (post-traumatic stress disorder) 06/26/2019  -Polycystic kidney disease  Past Surgical History:  Procedure Laterality Date  . Left triceps repair Left 04/30/2019  Dr. Posey Pronto  . scrap metal removed 2005  from head   Family History  Problem Relation Age of Onset  . No Known Problems Mother   Social History   Socioeconomic History  . Marital status: Married  Spouse name: Not on file  . Number of children: Not on file  . Years of education: Not on file  . Highest education level: Not on file  Occupational History  . Not  on file  Tobacco Use  . Smoking status: Never Smoker  . Smokeless tobacco: Current User  Types: Chew  Vaping Use  . Vaping Use: Never used  Substance and Sexual Activity  . Alcohol use: Yes  . Drug use: Not on file  . Sexual activity: Not on file  Other Topics Concern  . Not on file  Social History Narrative  . Not on file   Social Determinants of Health   Financial Resource Strain: Not on file  Food Insecurity: Not on file  Transportation Needs: Not on file  Physical Activity: Not on file  Stress: Not on file  Social Connections: Not on file  Housing Stability: Not on file    Current Outpatient Medications Ordered in Epic  Medication Sig Dispense Refill  . acetaminophen (TYLENOL) 500 MG tablet Take 1,000 mg by mouth once daily  .  albuterol 90 mcg/actuation inhaler INHALE 2 PUFFS BY MOUTH EVERY FOUR HOURS AS NEEDED  . alcohol swabs PadM Alcohol Pads AS DIRECTED QID PER PATIENT  . amLODIPine (NORVASC) 10 MG tablet Take 1 tablet (10 mg total) by mouth once daily 90 tablet 3  . APPLE CIDER VINEGAR ORAL Take by mouth Take 30 mLs by mouth daily. 2 shots (Patient not taking: Reported on 03/10/2020 )  . aspirin 81 MG EC tablet Take 1 tablet (81 mg total) by mouth once daily 90 tablet 3  . atorvastatin (LIPITOR) 10 MG tablet Take 1 tablet (10 mg total) by mouth once daily (Patient not taking: Reported on 03/10/2020 ) 90 tablet 11  . cholecalciferol (VITAMIN D3) 2,000 unit tablet Take 1 tablet (2,000 Units total) by mouth once daily 90 tablet 11  . EPINEPHrine (EPIPEN) 0.3 mg/0.3 mL auto-injector INJECT 0.3 ML INTRAMUSCULARLY AS DIRECTED AS NEEDED  . glipiZIDE (GLUCOTROL) 10 MG tablet Take 1 tablet (10 mg total) by mouth once daily (Patient taking differently: Take 10 mg by mouth 2 (two) times daily before meals ) 90 tablet 3  . insulin ASPART (NOVOLOG FLEXPEN) pen injector (concentration 100 units/mL) Inject 12 Units subcutaneously 3 (three) times daily with meals (Patient not taking: Reported on 03/10/2020 ) 15 mL 12  . insulin GLARGINE (LANTUS SOLOSTAR U-100 INSULIN) pen injector (concentration 100 units/mL) Inject 18 Units subcutaneously nightly And needles also (Patient taking differently: Inject 32 Units subcutaneously nightly And needles also ) 15 mL 11  . insulin LISPRO (HUMALOG KWIKPEN) pen injector (concentration 100 units/mL) Inject 12 Units subcutaneously 3 (three) times daily with meals And needles too please (Patient not taking: Reported on 03/10/2020 ) 15 mL 12  . JYNARQUE 15 mg (AM)/ 15 mg (PM) TbSQ (Patient not taking: Reported on 01/15/2020 )  . losartan (COZAAR) 50 MG tablet Take 1 tablet by mouth once daily (Patient not taking: Reported on 03/10/2020 )  . pen needle, diabetic 31 gauge x 3/16" needle Use as directed 100  each 12   No current Epic-ordered facility-administered medications on file.   No Known Allergies  Review of Systems: A 10+ ROS was performed, reviewed, and the pertinent orthopaedic findings are documented in the HPI. I have reviewed and agree with the ROS captured by the CMA.   Physical Exam: Vitals:  03/12/20 0910  BP: (!) 140/80   General/Constitutional: NAD, conversant Eyes: Pupils equal and round, extraocular movements intact ENT: atraumatic external nose and ears, moist mucous membranes Respiratory: non-labored breathing, symmetric chest rise, chest sounds clear. Cardiovascular: no visible lower extremity edema, peripheral pulses present, regular rate and rhythm  Skin: normal skin turgor, warm and dry Neurological: cranial nerves grossly intact, sensation grossly intact Psychological: Appropriate mood and affect; appropriate judgment Musculoskeletal: as detailed below:  Comprehensive Hand Exam: Right Left  Inspection:  Skin approximately 1 cm x 1 cm mass on the volar aspect of the fourth finger just proximal to the PIP joint. This mass has mild discoloration about the base with significant tenderness. There is also mild bleeding overlying this region.  Mallet finger / extension lag  Swan neck deformity  Boutonniere deformity  Ulnar drift  Dupuytryn's contracture  Atrophy  Active (passive) ROM:  Thumb  Index  Middle  Ring normal range of motion  Little  Palpation:  Thumb  Index  Middle  Ring  Little  Metacarpals/hand  Neuro:  Motor - AIN, PIN, ulnar normal  Light touch - med/rad/uln normal  Vascular:  Capillary refill normal  Radial pulse normal  Ulnar pulse  Special tests:  Trigger digit - clicking / crepitus  Trigger digit locking  Wrist flexion/compression test  Tinel's Test at wrist  Elbow flexion/compression test  Tinel's Test at elbow  Finkelstein's test  Allen's test  DeQuervain's test  Strength Testing  Grip strength  Tip pinch   Interossei  EPL  Other   Imaging:  L Elbow radiographs:  04/23/19: FINDINGS:   Normal elbow alignment. No fractures or dislocations. Mild posterior distal humerus soft tissue swelling. No joint effusion. Well maintained joint spaces without evidence of degenerative joint disease. No loose  bodies. No abnormal bone lesions. No enthesopathic changes around the elbow.  R elbow radiographs: 01/23/20: 3 views of the right elbow (AP, lateral, oblique) were obtained. There are no fractures or dislocations. There are no notable degenerative changes. There is no joint effusion.  R knee radiographs: 08/08/19: 4 views of the right knee (weightbearing AP, Lutricia Feil, [employer], lateral) were obtained. There are severe degenerative changes of the right knee with complete loss of joint space of the medial compartment as well as increased subchondral sclerosis of the medial tibial plateau and medial femoral condyle. Additionally there is osteophyte formation of the medial compartment. Lateral compartment appears relatively well-preserved. There is significant patellar osteophyte formation bilaterally. There is also a significant joint effusion of the right knee. Regarding the left knee, there appears to be significant lateral patellar tilt and joint spaces appear relatively well-maintained. There are some small notch osteophytes of the left knee as well. There are no fractures or dislocations present.  L elbow MRI: 04/24/19: FINDINGS: TENDONS  Common forearm flexor origin: Intact with normal signal.  Common forearm extensor origin: Intact with normal signal.  Biceps: Intact.  Triceps: There is a complete tear of the posterior superficial component of the triceps tendon (combined tendon of the lateral and long heads). This tear is retracted by approximately 2.9 cm on coronal image 25/9. The deep anterior component (medial head insertion) is intact.  LIGAMENTS  Medial stabilizers:  Intact.  Lateral stabilizers: The lateral ulnar and radial collateral ligaments appear intact.  Cartilage: Preserved. No focal chondral defect demonstrated.  Joint: No joint effusion or loose body observed.  Cubital tunnel: Unremarkable. The ulnar nerve appears normal.  Bones: No acute or significant extra-articular osseous findings.  Other: Moderate subcutaneous edema posteriorly, extending both medially and laterally. No focal hematoma.  IMPRESSION: 1. Complete tear of the posterior superficial component of the triceps tendon (combined tendon of the lateral and long heads) with moderate retraction and surrounding subcutaneous edema. 2. The additional elbow tendons and ligaments appear intact.  3. No acute osseous findings. I personally reviewed and visualized the aforementioned imaging studies. I additionally personally interpreted any radiographs taken during today's visit.  Assessment & Plan: There are no diagnoses linked to this encounter.  Marcelis Scacco is a 50 y.o. male patient with left fourth finger mass. 1. Patient was discussed with my orthopedic partner, Dr. Rudene Christians. Ultrasound evaluation was recommended to see if the mass was attached to the tendon sheath. Please see Dr. Pauline Aus separate note from today regarding ultrasound evaluation. This mass did not seem to be attached to the tendon. Given these findings, tenderness limiting the patient's work and activity, and concern for infection given the chronicity of the symptoms, surgical excision was recommended. The patient is in agreement with this plan. Dr. Rudene Christians will plan to perform an excision of the left fourth finger mass in the near future.  2. Follow-up with Dr. Rudene Christians for procedure and postoperative care.    Electronically signed by Jeffery Reusing, MD at 03/12/2020 9:52 AM EST    Reviewed  H+P. No changes noted. Have subsequently seen the patient in the office and he understands risk benefits possible  complications plan is for excision of masses this could be a skin cancer

## 2020-03-25 NOTE — Discharge Instructions (Addendum)
Keep dressing clean and dry Keep arm elevated Pain medicine as directed Loosen Ace wrap if it feels too tight later today but leave underlying wrap on and rewrap with Ace   AMBULATORY SURGERY  DISCHARGE INSTRUCTIONS   1) The drugs that you were given will stay in your system until tomorrow so for the next 24 hours you should not:  A) Drive an automobile B) Make any legal decisions C) Drink any alcoholic beverage   2) You may resume regular meals tomorrow.  Today it is better to start with liquids and gradually work up to solid foods.  You may eat anything you prefer, but it is better to start with liquids, then soup and crackers, and gradually work up to solid foods.   3) Please notify your doctor immediately if you have any unusual bleeding, trouble breathing, redness and pain at the surgery site, drainage, fever, or pain not relieved by medication.  4) Your post-operative visit with Dr.                                     is: Date:                        Time:    Please call to schedule your post-operative visit.  5) Additional Instructions:

## 2020-03-25 NOTE — Anesthesia Procedure Notes (Signed)
Procedure Name: LMA Insertion Date/Time: 03/25/2020 2:58 PM Performed by: Hedda Slade, CRNA Pre-anesthesia Checklist: Patient identified, Patient being monitored, Timeout performed, Emergency Drugs available and Suction available Patient Re-evaluated:Patient Re-evaluated prior to induction Oxygen Delivery Method: Circle system utilized Preoxygenation: Pre-oxygenation with 100% oxygen Induction Type: IV induction Ventilation: Mask ventilation without difficulty LMA: LMA inserted LMA Size: 4.5 Tube type: Oral Number of attempts: 1 Placement Confirmation: positive ETCO2 and breath sounds checked- equal and bilateral Tube secured with: Tape Dental Injury: Teeth and Oropharynx as per pre-operative assessment

## 2020-03-25 NOTE — Op Note (Signed)
03/25/2020  3:33 PM  PATIENT:  Jeffery Hobbs  50 y.o. male  PRE-OPERATIVE DIAGNOSIS:  Mass of finger of left hand R22.32  POST-OPERATIVE DIAGNOSIS:   Same PROCEDURE:  Procedure(s): left, ring finger mass excision (Left)  SURGEON: Laurene Footman, MD  ASSISTANTS: None  ANESTHESIA:   general  EBL:  Total I/O In: 100 [IV Piggyback:100] Out: -   BLOOD ADMINISTERED:none  DRAINS: none   LOCAL MEDICATIONS USED:  MARCAINE     SPECIMEN:  Source of Specimen:  Mass from left ring finger and culture  DISPOSITION OF SPECIMEN:  Pathology and microbiology  COUNTS:  YES  TOURNIQUET: 14 minutes at 250 mmHg  IMPLANTS: None  DICTATION: .Dragon Dictation patient was brought to the operating room and after adequate anesthesia was obtained the left arm was prepped and draped in the usual sterile fashion.  After patient identification and timeout procedures were completed an oblique incision was made across the base of the proximal phalanx volarly extending to the mass then slightly distally and under oblique fashion.  The skin was spread and there was some adjacent skin tissue which appeared to be adherent to the mass which was excised along with the mass and sent as a specimen.  A culture was obtained for routine as well as AFB and fungal in case this was a chronic infection.  Following this the wound was explored and there were no additional masses identified the mass had been protruding through the skin.  The flexor tendon sheath was intact it was irrigated the tendon was intact the wound was then irrigated and injection of 10 cc half percent Sensorcaine injected at the base of the finger for postop analgesia.  The wound could be closed even though some skin had been excised in a simple interrupted fashion with 4-0 nylon Xeroform 4 x 4's web roll and an Ace wrap then applied tourniquet let down the close of the case  PLAN OF CARE: Discharge to home after PACU  PATIENT DISPOSITION:  PACU -  hemodynamically stable.

## 2020-03-25 NOTE — Anesthesia Preprocedure Evaluation (Signed)
Anesthesia Evaluation  Patient identified by MRN, date of birth, ID band Patient awake    Reviewed: Allergy & Precautions, NPO status , Patient's Chart, lab work & pertinent test results  History of Anesthesia Complications Negative for: history of anesthetic complications  Airway Mallampati: II       Dental  (+) Poor Dentition, Chipped, Dental Advidsory Given   Pulmonary neg pulmonary ROS, neg shortness of breath, neg sleep apnea, neg COPD, neg recent URI, Not current smoker,           Cardiovascular Exercise Tolerance: Good hypertension, (-) angina(-) Past MI and (-) CHF (-) dysrhythmias (-) Valvular Problems/Murmurs     Neuro/Psych neg Seizures PSYCHIATRIC DISORDERS Anxiety    GI/Hepatic Neg liver ROS, neg GERD  ,  Endo/Other  diabetes, Type 2, Oral Hypoglycemic Agents  Renal/GU Renal disease (polycystic kidney dz)     Musculoskeletal   Abdominal   Peds  Hematology   Anesthesia Other Findings Past Medical History: No date: Anxiety No date: CKD (chronic kidney disease) No date: Diabetes mellitus without complication (HCC)     Comment:  type 2 No date: Essential hypertension No date: Polycystic kidney No date: PTSD (post-traumatic stress disorder)     Comment:  d/t war experiences   Reproductive/Obstetrics                             Anesthesia Physical  Anesthesia Plan  ASA: III  Anesthesia Plan: General   Post-op Pain Management:    Induction: Intravenous  PONV Risk Score and Plan: 2 and Ondansetron, Midazolam and Dexamethasone  Airway Management Planned: LMA  Additional Equipment:   Intra-op Plan:   Post-operative Plan:   Informed Consent: I have reviewed the patients History and Physical, chart, labs and discussed the procedure including the risks, benefits and alternatives for the proposed anesthesia with the patient or authorized representative who has indicated  his/her understanding and acceptance.       Plan Discussed with:   Anesthesia Plan Comments:         Anesthesia Quick Evaluation

## 2020-03-26 NOTE — Anesthesia Postprocedure Evaluation (Signed)
Anesthesia Post Note  Patient: Jeffery Hobbs  Procedure(s) Performed: left, ring finger mass excision (Left Wrist)  Patient location during evaluation: PACU Anesthesia Type: General Level of consciousness: awake and alert Pain management: pain level controlled Vital Signs Assessment: post-procedure vital signs reviewed and stable Respiratory status: spontaneous breathing, nonlabored ventilation, respiratory function stable and patient connected to nasal cannula oxygen Cardiovascular status: blood pressure returned to baseline and stable Postop Assessment: no apparent nausea or vomiting Anesthetic complications: no   No complications documented.   Last Vitals:  Vitals:   03/25/20 1653 03/25/20 1709  BP: (!) 161/93 (!) 149/95  Pulse: 84 87  Resp: 16 16  Temp: 36.7 C   SpO2: 97% 96%    Last Pain:  Vitals:   03/25/20 1709  TempSrc:   PainSc: 0-No pain                 Martha Clan

## 2020-03-27 ENCOUNTER — Encounter: Payer: Self-pay | Admitting: Orthopedic Surgery

## 2020-03-27 LAB — SURGICAL PATHOLOGY

## 2020-03-30 LAB — AEROBIC/ANAEROBIC CULTURE W GRAM STAIN (SURGICAL/DEEP WOUND)

## 2020-03-31 LAB — ACID FAST SMEAR (AFB, MYCOBACTERIA): Acid Fast Smear: NEGATIVE

## 2020-04-26 LAB — FUNGUS CULTURE WITH STAIN

## 2020-04-26 LAB — FUNGUS CULTURE RESULT

## 2020-04-26 LAB — FUNGAL ORGANISM REFLEX

## 2020-05-29 LAB — ACID FAST CULTURE WITH REFLEXED SENSITIVITIES (MYCOBACTERIA): Acid Fast Culture: NEGATIVE

## 2020-06-20 ENCOUNTER — Encounter: Payer: Self-pay | Admitting: Emergency Medicine

## 2020-06-20 ENCOUNTER — Emergency Department: Payer: BC Managed Care – PPO

## 2020-06-20 ENCOUNTER — Emergency Department
Admission: EM | Admit: 2020-06-20 | Discharge: 2020-06-20 | Disposition: A | Discharge status: Home / Self Care | Payer: BC Managed Care – PPO | Attending: Emergency Medicine | Admitting: Emergency Medicine

## 2020-06-20 ENCOUNTER — Other Ambulatory Visit: Payer: Self-pay

## 2020-06-20 DIAGNOSIS — Z794 Long term (current) use of insulin: Secondary | ICD-10-CM | POA: Diagnosis not present

## 2020-06-20 DIAGNOSIS — Z79899 Other long term (current) drug therapy: Secondary | ICD-10-CM | POA: Diagnosis not present

## 2020-06-20 DIAGNOSIS — E1122 Type 2 diabetes mellitus with diabetic chronic kidney disease: Secondary | ICD-10-CM | POA: Diagnosis not present

## 2020-06-20 DIAGNOSIS — Z8616 Personal history of COVID-19: Secondary | ICD-10-CM | POA: Insufficient documentation

## 2020-06-20 DIAGNOSIS — R42 Dizziness and giddiness: Secondary | ICD-10-CM | POA: Insufficient documentation

## 2020-06-20 DIAGNOSIS — N189 Chronic kidney disease, unspecified: Secondary | ICD-10-CM | POA: Insufficient documentation

## 2020-06-20 DIAGNOSIS — I129 Hypertensive chronic kidney disease with stage 1 through stage 4 chronic kidney disease, or unspecified chronic kidney disease: Secondary | ICD-10-CM | POA: Diagnosis not present

## 2020-06-20 DIAGNOSIS — Z7982 Long term (current) use of aspirin: Secondary | ICD-10-CM | POA: Diagnosis not present

## 2020-06-20 DIAGNOSIS — R079 Chest pain, unspecified: Secondary | ICD-10-CM | POA: Insufficient documentation

## 2020-06-20 DIAGNOSIS — Z87891 Personal history of nicotine dependence: Secondary | ICD-10-CM | POA: Diagnosis not present

## 2020-06-20 DIAGNOSIS — Z7984 Long term (current) use of oral hypoglycemic drugs: Secondary | ICD-10-CM | POA: Insufficient documentation

## 2020-06-20 DIAGNOSIS — R519 Headache, unspecified: Secondary | ICD-10-CM | POA: Diagnosis not present

## 2020-06-20 LAB — BASIC METABOLIC PANEL
Anion gap: 10 (ref 5–15)
Anion gap: 8 (ref 5–15)
BUN: 36 mg/dL — ABNORMAL HIGH (ref 6–20)
BUN: 38 mg/dL — ABNORMAL HIGH (ref 6–20)
CO2: 22 mmol/L (ref 22–32)
CO2: 23 mmol/L (ref 22–32)
Calcium: 9.4 mg/dL (ref 8.9–10.3)
Calcium: 8.8 mg/dL — ABNORMAL LOW (ref 8.9–10.3)
Chloride: 107 mmol/L (ref 98–111)
Chloride: 107 mmol/L (ref 98–111)
Creatinine, Ser: 2.62 mg/dL — ABNORMAL HIGH (ref 0.61–1.24)
Creatinine, Ser: 2.53 mg/dL — ABNORMAL HIGH (ref 0.61–1.24)
GFR, Estimated: 29 mL/min — ABNORMAL LOW (ref >60)
GFR, Estimated: 30 mL/min — ABNORMAL LOW (ref >60)
Glucose, Bld: 142 mg/dL — ABNORMAL HIGH (ref 70–99)
Glucose, Bld: 142 mg/dL — ABNORMAL HIGH (ref 70–99)
Potassium: 4.6 mmol/L (ref 3.5–5.1)
Potassium: 4.6 mmol/L (ref 3.5–5.1)
Sodium: 139 mmol/L (ref 135–145)
Sodium: 138 mmol/L (ref 135–145)

## 2020-06-20 LAB — CBC
HCT: 44.7 % (ref 39.0–52.0)
Hemoglobin: 14.9 g/dL (ref 13.0–17.0)
MCH: 28.3 pg (ref 26.0–34.0)
MCHC: 33.3 g/dL (ref 30.0–36.0)
MCV: 85 fL (ref 80.0–100.0)
Platelets: 349 10*3/uL (ref 150–400)
RBC: 5.26 MIL/uL (ref 4.22–5.81)
RDW: 13 % (ref 11.5–15.5)
WBC: 4.9 10*3/uL (ref 4.0–10.5)
nRBC: 0 % (ref 0.0–0.2)

## 2020-06-20 LAB — TROPONIN I (HIGH SENSITIVITY)
Troponin I (High Sensitivity): 9 ng/L (ref <18)
Troponin I (High Sensitivity): 9 ng/L (ref <18)

## 2020-06-20 LAB — MAGNESIUM: Magnesium: 1.9 mg/dL (ref 1.7–2.4)

## 2020-06-20 MED ORDER — DIPHENHYDRAMINE HCL 50 MG/ML IJ SOLN
25.0000 mg | Freq: Once | INTRAMUSCULAR | Status: AC
  Administered 2020-06-20: 25 mg via INTRAVENOUS
  Filled 2020-06-20: qty 1

## 2020-06-20 MED ORDER — PROCHLORPERAZINE EDISYLATE 10 MG/2ML IJ SOLN
10.0000 mg | Freq: Once | INTRAMUSCULAR | Status: AC
  Administered 2020-06-20: 10 mg via INTRAVENOUS
  Filled 2020-06-20: qty 2

## 2020-06-20 MED ORDER — LACTATED RINGERS IV BOLUS
1000.0000 mL | Freq: Once | INTRAVENOUS | Status: AC
  Administered 2020-06-20: 1000 mL via INTRAVENOUS

## 2020-06-20 NOTE — ED Notes (Signed)
See triage note  Presents with left side headache and shape left sided chest pain and arm discomfort while driving this am

## 2020-06-20 NOTE — ED Notes (Signed)
Arm roll placed to assist w/ IVF. Pt sleeping, awakens to voice.

## 2020-06-20 NOTE — ED Triage Notes (Signed)
C/O left sided chest pain and left arm pain.  States onset this morning about 20 minutes PTA.

## 2020-06-20 NOTE — ED Provider Notes (Signed)
[facility] Emergency Department Provider Note  ____________________________________________   Event Date/Time   First MD Initiated Contact with Patient 06/20/20 1048     (approximate)  I have reviewed the triage vital signs and the nursing notes.   HISTORY  Chief Complaint Chest Pain   HPI Jeffery Hobbs is a 50 y.o. male with a past medical history anxiety, CKD, HTN, PTSD, and polycystic kidney disease who presents for assessment of some left-sided chest pain associate with headache.  Patient states he has had the chest pain for about 2 or 3 days but got particular bad today as well as a headache.  States the pain radiates into his left arm and down his left leg.    He denies any vision changes or vertigo but states that when he was driving to work today to stop his feeling lightheaded initially went to his regular doctor before being referred to the ED.  He denies any vomiting, diarrhea, dysuria, abdominal pain, back pain, rash or extremity pain.  No prior similar episodes.  No recent trauma or injuries.  He endorses intermittent 1-2 beers per night but is not a regular drinker and denies EtOH or illicit drug use.  Denies any known cardiac disease.         Past Medical History:  Diagnosis Date  . Anxiety   . CKD (chronic kidney disease)   . Diabetes mellitus without complication ([place])    type 2  . Essential hypertension   . Polycystic kidney   . PTSD (post-traumatic stress disorder)    d/t war experiences    Patient Active Problem List   Diagnosis Date Noted  . Pneumonia due to COVID-19 virus 02/08/2020    Past Surgical History:  Procedure Laterality Date  . FOREIGN BODY REMOVAL     x 2, schrapnel removed from head and arm.   Marland Kitchen GANGLION CYST EXCISION Left 03/25/2020   Procedure: left, ring finger mass excision;  Surgeon: Hessie Knows, MD;  Location: [facility] ORS;  Service: Orthopedics;  Laterality: Left;  . TRICEPS TENDON REPAIR Left 04/30/2019    Procedure: LEFT TRICEPS REPAIR;  Surgeon: Leim Fabry, MD;  Location: [facility] ORS;  Service: Orthopedics;  Laterality: Left;    Prior to Admission medications   Medication Sig Start Date End Date Taking? Authorizing Provider  albuterol (VENTOLIN HFA) 108 (90 Base) MCG/ACT inhaler Inhale 2 puffs into the lungs every 8 (eight) hours as needed for cough. 07/05/19   [provider]  amLODipine (NORVASC) 10 MG tablet Take 10 mg by mouth daily.    [provider]  aspirin EC 81 MG tablet Take 81 mg by mouth daily.    [provider]  glipiZIDE (GLUCOTROL) 10 MG tablet Take 10 mg by mouth 2 (two) times daily before a meal.    [provider]  HYDROcodone-acetaminophen (NORCO) 5-325 MG tablet Take 1 tablet by mouth every 6 (six) hours as needed for moderate pain. 03/25/20   Hessie Knows, MD  LANTUS SOLOSTAR 100 UNIT/ML Solostar Pen Inject 30 Units into the skin at bedtime. 11/16/19   [provider]    Allergies Patient has no known allergies.  Family History  Problem Relation Age of Onset  . Cancer Mother   . Heart attack Father     Social History Social History   Tobacco Use  . Smoking status: Never Smoker  . Smokeless tobacco: Former Network engineer  . Vaping Use: Never used  Substance Use Topics  .  Alcohol use: Yes    Alcohol/week: 2.0 standard drinks    Types: 2 Cans of beer per week  . Drug use: Not Currently    Review of Systems  Review of Systems  Constitutional: Negative for chills and fever.  HENT: Negative for sore throat.   Eyes: Negative for pain.  Respiratory: Negative for cough and stridor.   Cardiovascular: Positive for chest pain and palpitations.  Gastrointestinal: Negative for abdominal pain, diarrhea and vomiting.  Genitourinary: Negative for dysuria.  Musculoskeletal: Negative for myalgias.  Skin: Negative for rash.  Neurological: Positive for dizziness and headaches. Negative for seizures and loss of  consciousness.  Psychiatric/Behavioral: Negative for suicidal ideas.  All other systems reviewed and are negative.     ____________________________________________   PHYSICAL EXAM:  VITAL SIGNS: ED Triage Vitals  Enc Vitals Group     BP --      Pulse --      Resp --      Temp --      Temp src --      SpO2 --      Weight 06/20/20 1028 220 lb 0.3 oz (99.8 kg)     Height 06/20/20 1028 '5\' 9"'$  (1.753 m)     Head Circumference --      Peak Flow --      Pain Score 06/20/20 1027 8     Pain Loc --      Pain Edu? --      Excl. in [place]? --    Vitals:   06/20/20 1300 06/20/20 1330  BP: 137/78 125/82  Pulse: 64 68  Resp: 12 16  Temp:    SpO2: 97% 95%   Physical Exam Vitals and nursing note reviewed.  Constitutional:      Appearance: He is well-developed.  HENT:     Head: Normocephalic and atraumatic.  Eyes:     Conjunctiva/sclera: Conjunctivae normal.  Cardiovascular:     Rate and Rhythm: Normal rate and regular rhythm.     Heart sounds: No murmur heard.   Pulmonary:     Effort: Pulmonary effort is normal. No respiratory distress.     Breath sounds: Normal breath sounds.  Abdominal:     Palpations: Abdomen is soft.     Tenderness: There is no abdominal tenderness.  Musculoskeletal:     Cervical back: Neck supple.  Skin:    General: Skin is warm and dry.  Neurological:     Mental Status: He is alert.     2+ radial pulses.  Cranial nerves II through XII grossly intact.  No pronator drift.  No finger dysmetria.  Patient has full and symmetric strength in all extremities.  Stable ambulate with steady gait unassisted. ____________________________________________   LABS (all labs ordered are listed, but only abnormal results are displayed)  Labs Reviewed  BASIC METABOLIC PANEL - Abnormal; Notable for the following components:      Result Value   Glucose, Bld 142 (*)    BUN 36 (*)    Creatinine, Ser 2.62 (*)    GFR, Estimated 29 (*)    All other components within  normal limits  BASIC METABOLIC PANEL - Abnormal; Notable for the following components:   Glucose, Bld 142 (*)    BUN 38 (*)    Creatinine, Ser 2.53 (*)    Calcium 8.8 (*)    GFR, Estimated 30 (*)    All other components within normal limits  CBC  MAGNESIUM  TROPONIN I (HIGH SENSITIVITY)  TROPONIN I (HIGH SENSITIVITY)   ____________________________________________  EKG  Sinus rhythm with a ventricular rate of 90, normal axis, unremarkable intervals without evidence of acute ischemia or significant underlying arrhythmia. ____________________________________________  RADIOLOGY  ED MD interpretation: Chest x-ray has no focal consolidation, large effusion, significant edema, pneumothorax or any other clear acute intrathoracic process.  CT head is unremarkable.   Official radiology report(s): DG Chest 2 View  Result Date: 06/20/2020 CLINICAL DATA:  Chest pain EXAM: CHEST - 2 VIEW COMPARISON:  02/07/2020 FINDINGS: The heart size and mediastinal contours are within normal limits. No focal airspace consolidation, pleural effusion, or pneumothorax. The visualized skeletal structures are unremarkable. IMPRESSION: No active cardiopulmonary disease. Electronically Signed   By: Davina Poke D.O.   On: 06/20/2020 11:29   CT Head Wo Contrast  Result Date: 06/20/2020 CLINICAL DATA:  Left-sided headache EXAM: CT HEAD WITHOUT CONTRAST TECHNIQUE: Contiguous axial images were obtained from the base of the skull through the vertex without intravenous contrast. COMPARISON:  None. FINDINGS: Brain: No evidence of acute infarction, hemorrhage, hydrocephalus, extra-axial collection or mass lesion/mass effect. Vascular: No hyperdense vessel or unexpected calcification. Skull: Normal. Negative for fracture or focal lesion. Sinuses/Orbits: No acute finding. Other: None. IMPRESSION: No acute intracranial findings. Electronically Signed   By: Davina Poke D.O.   On: 06/20/2020 11:32     ____________________________________________   PROCEDURES  Procedure(s) performed (including Critical Care):  .1-3 Lead EKG Interpretation Performed by: Lucrezia Starch, MD Authorized by: Lucrezia Starch, MD     Interpretation: normal     ECG rate assessment: normal     Rhythm: sinus rhythm     Ectopy: none     Conduction: normal       ____________________________________________   INITIAL IMPRESSION / ASSESSMENT AND PLAN / ED COURSE      Patient presents with above-stated history exam for assessment of some chest pain associate with headache with pain rating into his left arm and left leg.  This started today becoming much more severe after being a little less severe over the last 2 to 3 days.  Was associate with some dizziness.  On arrival he is hypertensive with BP of 133/85 with otherwise stable vital signs on room air.  With regard to his headache differential includes possible migraine versus related to his blood pressure, SAH, and metabolic derangements.  No history or exam findings of recent trauma or evidence on exam of to space infection in the head or neck.  CT head is unremarkable.  Patient given below noted headache cocktail and on reassessment stated headache had completely resolved.   With regard to his chest discomfort differential includes ACS, myocarditis, pneumonia, thorax, effusion, edema, dissection, PE and GI etiologies.  Overall low suspicion for PE as patient has no clear risk factors and is not tachycardic hypoxic tachypneic and denies any shortness of breath and describes it as chest discomfort is some tightness.  Chest x-ray has no evidence of pneumonia pneumothorax or other clear acute process.  ECG is reassuring and given nonelevated troponin x2 I have a low suspicion for ACS or arrhythmia.  BMP shows no significant electrode or metabolic derangements with kidney function at baseline.  CBC is unremarkable.  Low suspicion for dissection at this  time given patient had complete resolution of his chest pain after headache cocktail with pneumomediastinum and symmetric upper extremity pulses.  Given complete resolution of symptoms with otherwise stable vitals and reassuring exam work-up I think is safe for discharge with  close outpatient follow-up.  Discharged stable condition.  Strict return precautions advised and discussed.         ____________________________________________   FINAL CLINICAL IMPRESSION(S) / ED DIAGNOSES  Final diagnoses:  Chest pain, unspecified type    Medications  prochlorperazine (COMPAZINE) injection 10 mg (10 mg Intravenous Given 06/20/20 1116)  diphenhydrAMINE (BENADRYL) injection 25 mg (25 mg Intravenous Given 06/20/20 1116)  lactated ringers bolus 1,000 mL (0 mLs Intravenous Stopped 06/20/20 1346)     ED Discharge Orders    None       Note:  This document was prepared using Dragon voice recognition software and may include unintentional dictation errors.   Lucrezia Starch, MD 06/20/20 [phone]

## 2020-06-20 NOTE — ED Notes (Signed)
Pt with CT. 

## 2021-03-07 ENCOUNTER — Emergency Department
Admission: EM | Admit: 2021-03-07 | Discharge: 2021-03-07 | Disposition: A | Discharge status: Home / Self Care | Payer: Medicaid Other | Attending: Emergency Medicine | Admitting: Emergency Medicine

## 2021-03-07 ENCOUNTER — Encounter: Payer: Self-pay | Admitting: Emergency Medicine

## 2021-03-07 ENCOUNTER — Other Ambulatory Visit: Payer: Self-pay

## 2021-03-07 DIAGNOSIS — F419 Anxiety disorder, unspecified: Secondary | ICD-10-CM | POA: Diagnosis not present

## 2021-03-07 DIAGNOSIS — R42 Dizziness and giddiness: Secondary | ICD-10-CM | POA: Diagnosis not present

## 2021-03-07 DIAGNOSIS — I10 Essential (primary) hypertension: Secondary | ICD-10-CM

## 2021-03-07 DIAGNOSIS — I129 Hypertensive chronic kidney disease with stage 1 through stage 4 chronic kidney disease, or unspecified chronic kidney disease: Secondary | ICD-10-CM | POA: Insufficient documentation

## 2021-03-07 DIAGNOSIS — N189 Chronic kidney disease, unspecified: Secondary | ICD-10-CM | POA: Diagnosis not present

## 2021-03-07 DIAGNOSIS — E1122 Type 2 diabetes mellitus with diabetic chronic kidney disease: Secondary | ICD-10-CM | POA: Insufficient documentation

## 2021-03-07 LAB — COMPREHENSIVE METABOLIC PANEL
ALT: 21 U/L (ref 0–44)
AST: 15 U/L (ref 15–41)
Albumin: 4 g/dL (ref 3.5–5.0)
Alkaline Phosphatase: 62 U/L (ref 38–126)
Anion gap: 7 (ref 5–15)
BUN: 42 mg/dL — ABNORMAL HIGH (ref 6–20)
CO2: 23 mmol/L (ref 22–32)
Calcium: 9.2 mg/dL (ref 8.9–10.3)
Chloride: 107 mmol/L (ref 98–111)
Creatinine, Ser: 3.01 mg/dL — ABNORMAL HIGH (ref 0.61–1.24)
GFR, Estimated: 24 mL/min — ABNORMAL LOW (ref >60)
Glucose, Bld: 144 mg/dL — ABNORMAL HIGH (ref 70–99)
Potassium: 4.8 mmol/L (ref 3.5–5.1)
Sodium: 137 mmol/L (ref 135–145)
Total Bilirubin: 0.6 mg/dL (ref 0.3–1.2)
Total Protein: 7.5 g/dL (ref 6.5–8.1)

## 2021-03-07 LAB — URINALYSIS, COMPLETE (UACMP) WITH MICROSCOPIC
Bacteria, UA: NONE SEEN
Bilirubin Urine: NEGATIVE
Glucose, UA: NEGATIVE mg/dL
Ketones, ur: NEGATIVE mg/dL
Leukocytes,Ua: NEGATIVE
Nitrite: NEGATIVE
Protein, ur: 100 mg/dL — AB
Specific Gravity, Urine: 1.01 (ref 1.005–1.030)
Squamous Epithelial / HPF: NONE SEEN (ref 0–5)
pH: 5 (ref 5.0–8.0)

## 2021-03-07 LAB — CBC
HCT: 44.6 % (ref 39.0–52.0)
Hemoglobin: 14.3 g/dL (ref 13.0–17.0)
MCH: 28.5 pg (ref 26.0–34.0)
MCHC: 32.1 g/dL (ref 30.0–36.0)
MCV: 88.8 fL (ref 80.0–100.0)
Platelets: 371 10*3/uL (ref 150–400)
RBC: 5.02 MIL/uL (ref 4.22–5.81)
RDW: 13.1 % (ref 11.5–15.5)
WBC: 6.7 10*3/uL (ref 4.0–10.5)
nRBC: 0 % (ref 0.0–0.2)

## 2021-03-07 LAB — TROPONIN I (HIGH SENSITIVITY): Troponin I (High Sensitivity): 10 ng/L (ref <18)

## 2021-03-07 MED ORDER — SODIUM CHLORIDE 0.9 % IV BOLUS
1000.0000 mL | Freq: Once | INTRAVENOUS | Status: AC
Start: 2021-03-07 — End: 2021-03-07
  Administered 2021-03-07: 1000 mL via INTRAVENOUS

## 2021-03-07 MED ORDER — LORAZEPAM 2 MG/ML IJ SOLN
1.0000 mg | Freq: Once | INTRAMUSCULAR | Status: AC
  Administered 2021-03-07: 1 mg via INTRAVENOUS
  Filled 2021-03-07 (×2): qty 1

## 2021-03-07 NOTE — ED Triage Notes (Signed)
Pt via POV from home. Pt c/o dizziness, HTN, and bilateral flank pain. Pt states he has been feeling dizzy for the past week and also endorses bilateral leg numbness that started yesterday. Pt states he BP has been elevated and pt has been taking medication as prescribed. Pt also endorses bilateral flank pain. Pt has a hx of CKD. Pt is A&Ox4 and NAD. Ambulatory to room.

## 2021-03-07 NOTE — ED Provider Notes (Signed)
[facility] Provider Note    Event Date/Time   First MD Initiated Contact with Patient 03/07/21 0820     (approximate)  History   Chief Complaint: Dizziness  HPI  Ezekial Arns is a 51 y.o. male with a past medical history of anxiety, CKD, diabetes, hypertension, PTSD, presents to the emergency department with dizziness and hypertension.  According to the patient since last night he has been experiencing some dizziness, also experiencing bilateral flank pain which he states is chronic for the past 20+ years since being diagnosed with polycystic kidney disease but sometimes is worse than others.  Patient states this morning he was feeling dizzy and took his blood pressure.  States it was elevated to around 170, took it multiple times and it went as high as 818 systolic.  Patient states he was concerned so he came to the emergency department for evaluation.  Patient did take his blood pressure medications this morning.  After more in-depth conversation with the patient he does state yesterday he had an [employer] function, states when involved a lot of people in uniforms in a small room which he admits did affect his anxiety/PTSD.  Patient also states he was experiencing war/army dreams last night.  Here patient does appear quite anxious.  Denies any chest pain.  Denies any fever cough.  Physical Exam   Triage Vital Signs: ED Triage Vitals  Enc Vitals Group     BP 03/07/21 0804 (!) 167/99     Pulse Rate 03/07/21 0804 93     Resp 03/07/21 0804 18     Temp 03/07/21 0804 98.4 F (36.9 C)     Temp Source 03/07/21 0804 Oral     SpO2 03/07/21 0804 96 %     Weight 03/07/21 0801 215 lb (97.5 kg)     Height 03/07/21 0801 5\' 9"  (1.753 m)     Head Circumference --      Peak Flow --      Pain Score 03/07/21 0800 6     Pain Loc --      Pain Edu? --      Excl. in [place]? --     Most recent vital signs: Vitals:   03/07/21 0804  BP: (!) 167/99  Pulse: 93  Resp: 18  Temp: 98.4 F  (36.9 C)  SpO2: 96%    General: Awake, no distress.  Fairly anxious appearing. CV:  Good peripheral perfusion.  Regular rate and rhythm  Resp:  Normal effort.  Equal breath sounds bilaterally.  Abd:  No distention.  Soft, nontender.  No rebound or guarding.   ED Results / Procedures / Treatments   EKG  EKG viewed and interpreted by myself shows a normal sinus rhythm at 93 bpm with a narrow QRS, normal axis, normal intervals, no concerning ST changes.   MEDICATIONS ORDERED IN ED: Medications  LORazepam (ATIVAN) injection 1 mg (has no administration in time range)  sodium chloride 0.9 % bolus 1,000 mL (has no administration in time range)     IMPRESSION / MDM / ASSESSMENT AND PLAN / ED COURSE  I reviewed the triage vital signs and the nursing notes.  Patient presents to the emergency department for high blood pressure and dizziness sensation.  Patient also states bilateral flank pain which is chronic.  Patient admits an anxiety inducing situation yesterday with the [employer] event along with war dreams last night per patient, this could very likely have caused anxiety/stress or worsened his PTSD leading  to the hypertension.  No chest pain.  Overall the patient appears well.  Does state flank pain but states this is chronic x20+ years.  We will check a urinalysis.  We will check labs including a cardiac enzyme.  EKG is reassuring.  We will dose Ativan and normal saline and continue to closely monitor.  Blood pressure currently 167/99 remainder the vitals are reassuring.  Systolic is down to 967.  Patient continues to appear quite well.  Lab work is reassuring.  Urinalysis is normal.  Creatinine slightly worse than typical patient has received a liter of fluid.  Remainder the chemistry is reassuring.  CBC is normal including normal white blood cell count.  Troponin negative.  Given the patient's reassuring work-up I believe the patient will be safe for discharge home with PCP follow-up.  Highly  suspect anxiety be playing a major role in the patient's blood pressure as well as day-to-day activities.  Patient states he has followed up with his doctor recently for anxiety and has been recently started on new anxiety medication.  I did review the patient's most recent PCP note from 11/13/2020 patient has a diagnosis of stage IV CKD.  Patient will follow-up with his PCP.  FINAL CLINICAL IMPRESSION(S) / ED DIAGNOSES   Hypertension Dizziness Anxiety  Rx / DC Orders   PCP follow-up   Note:  This document was prepared using Dragon voice recognition software and may include unintentional dictation errors.   Harvest Dark, MD 03/07/21 [phone]

## 2021-07-10 ENCOUNTER — Other Ambulatory Visit: Payer: Self-pay | Admitting: Physician Assistant

## 2021-07-10 DIAGNOSIS — M25561 Pain in right knee: Secondary | ICD-10-CM

## 2021-07-23 ENCOUNTER — Ambulatory Visit: Payer: Medicaid Other

## 2021-07-24 ENCOUNTER — Ambulatory Visit
Admission: RE | Admit: 2021-07-24 | Discharge: 2021-07-24 | Disposition: A | Discharge status: Home / Self Care | Payer: No Typology Code available for payment source | Source: Ambulatory Visit | Attending: Physician Assistant | Admitting: Physician Assistant

## 2021-07-24 DIAGNOSIS — M25561 Pain in right knee: Secondary | ICD-10-CM | POA: Insufficient documentation

## 2021-08-04 ENCOUNTER — Other Ambulatory Visit: Payer: Self-pay | Admitting: Orthopedic Surgery

## 2021-08-04 ENCOUNTER — Ambulatory Visit
Admission: RE | Admit: 2021-08-04 | Discharge: 2021-08-04 | Disposition: A | Discharge status: Home / Self Care | Payer: No Typology Code available for payment source | Source: Ambulatory Visit | Attending: Orthopedic Surgery | Admitting: Orthopedic Surgery

## 2021-08-04 ENCOUNTER — Ambulatory Visit
Admission: RE | Admit: 2021-08-04 | Discharge: 2021-08-04 | Disposition: A | Discharge status: Home / Self Care | Payer: No Typology Code available for payment source | Attending: Orthopedic Surgery | Admitting: Orthopedic Surgery

## 2021-08-04 DIAGNOSIS — M1711 Unilateral primary osteoarthritis, right knee: Secondary | ICD-10-CM

## 2021-08-04 NOTE — Progress Notes (Signed)
Bilateral standing hip to ankle x-rays on one cassette for evaluation of alignment 

## 2021-09-08 ENCOUNTER — Other Ambulatory Visit: Payer: Self-pay | Admitting: Nephrology

## 2021-09-08 DIAGNOSIS — N184 Chronic kidney disease, stage 4 (severe): Secondary | ICD-10-CM

## 2021-09-08 DIAGNOSIS — Q612 Polycystic kidney, adult type: Secondary | ICD-10-CM

## 2021-09-14 ENCOUNTER — Ambulatory Visit: Admission: RE | Admit: 2021-09-14 | Payer: Medicaid Other | Source: Ambulatory Visit

## 2021-09-27 ENCOUNTER — Other Ambulatory Visit: Payer: Self-pay

## 2021-09-27 ENCOUNTER — Emergency Department: Payer: No Typology Code available for payment source

## 2021-09-27 ENCOUNTER — Emergency Department
Admission: EM | Admit: 2021-09-27 | Discharge: 2021-09-28 | Disposition: A | Discharge status: Home / Self Care | Payer: No Typology Code available for payment source | Attending: Emergency Medicine | Admitting: Emergency Medicine

## 2021-09-27 DIAGNOSIS — R109 Unspecified abdominal pain: Secondary | ICD-10-CM

## 2021-09-27 DIAGNOSIS — Z20822 Contact with and (suspected) exposure to covid-19: Secondary | ICD-10-CM | POA: Diagnosis not present

## 2021-09-27 DIAGNOSIS — R8281 Pyuria: Secondary | ICD-10-CM

## 2021-09-27 DIAGNOSIS — N184 Chronic kidney disease, stage 4 (severe): Secondary | ICD-10-CM | POA: Diagnosis not present

## 2021-09-27 DIAGNOSIS — R10A2 Flank pain, left side: Secondary | ICD-10-CM

## 2021-09-27 DIAGNOSIS — I129 Hypertensive chronic kidney disease with stage 1 through stage 4 chronic kidney disease, or unspecified chronic kidney disease: Secondary | ICD-10-CM | POA: Insufficient documentation

## 2021-09-27 LAB — COMPREHENSIVE METABOLIC PANEL
ALT: 20 U/L (ref 0–44)
AST: 14 U/L — ABNORMAL LOW (ref 15–41)
Albumin: 3.8 g/dL (ref 3.5–5.0)
Alkaline Phosphatase: 68 U/L (ref 38–126)
Anion gap: 6 (ref 5–15)
BUN: 41 mg/dL — ABNORMAL HIGH (ref 6–20)
CO2: 24 mmol/L (ref 22–32)
Calcium: 9.1 mg/dL (ref 8.9–10.3)
Chloride: 108 mmol/L (ref 98–111)
Creatinine, Ser: 3.25 mg/dL — ABNORMAL HIGH (ref 0.61–1.24)
GFR, Estimated: 22 mL/min — ABNORMAL LOW (ref >60)
Glucose, Bld: 330 mg/dL — ABNORMAL HIGH (ref 70–99)
Potassium: 4.6 mmol/L (ref 3.5–5.1)
Sodium: 138 mmol/L (ref 135–145)
Total Bilirubin: 0.6 mg/dL (ref 0.3–1.2)
Total Protein: 7.4 g/dL (ref 6.5–8.1)

## 2021-09-27 LAB — CBC WITH DIFFERENTIAL/PLATELET
Abs Immature Granulocytes: 0.02 10*3/uL (ref 0.00–0.07)
Basophils Absolute: 0 10*3/uL (ref 0.0–0.1)
Basophils Relative: 1 %
Eosinophils Absolute: 0.4 10*3/uL (ref 0.0–0.5)
Eosinophils Relative: 5 %
HCT: 39.2 % (ref 39.0–52.0)
Hemoglobin: 12.6 g/dL — ABNORMAL LOW (ref 13.0–17.0)
Immature Granulocytes: 0 %
Lymphocytes Relative: 32 %
Lymphs Abs: 2.3 10*3/uL (ref 0.7–4.0)
MCH: 28.3 pg (ref 26.0–34.0)
MCHC: 32.1 g/dL (ref 30.0–36.0)
MCV: 88.1 fL (ref 80.0–100.0)
Monocytes Absolute: 0.8 10*3/uL (ref 0.1–1.0)
Monocytes Relative: 12 %
Neutro Abs: 3.5 10*3/uL (ref 1.7–7.7)
Neutrophils Relative %: 50 %
Platelets: 348 10*3/uL (ref 150–400)
RBC: 4.45 MIL/uL (ref 4.22–5.81)
RDW: 13.1 % (ref 11.5–15.5)
WBC: 7 10*3/uL (ref 4.0–10.5)
nRBC: 0 % (ref 0.0–0.2)

## 2021-09-27 LAB — TROPONIN I (HIGH SENSITIVITY)
Troponin I (High Sensitivity): 8 ng/L (ref <18)
Troponin I (High Sensitivity): 8 ng/L (ref <18)

## 2021-09-27 LAB — URINALYSIS, ROUTINE W REFLEX MICROSCOPIC
Bacteria, UA: NONE SEEN
Bilirubin Urine: NEGATIVE
Glucose, UA: >=500 mg/dL — AB
Hgb urine dipstick: NEGATIVE
Ketones, ur: NEGATIVE mg/dL
Nitrite: NEGATIVE
Protein, ur: 100 mg/dL — AB
Specific Gravity, Urine: 1.003 — ABNORMAL LOW (ref 1.005–1.030)
WBC, UA: >50 WBC/hpf — ABNORMAL HIGH (ref 0–5)
pH: 6 (ref 5.0–8.0)

## 2021-09-27 LAB — SARS CORONAVIRUS 2 BY RT PCR: SARS Coronavirus 2 by RT PCR: NEGATIVE

## 2021-09-27 NOTE — ED Provider Notes (Signed)
[facility] Provider Note    Event Date/Time   First MD Initiated Contact with Patient 09/27/21 2259     (approximate)   History   Hypertension   HPI  Jeffery Hobbs is a 51 y.o. male whose history includes polycystic kidney disease with innumerable renal cysts and stage IV chronic kidney disease who is under the care of Dr. Holley Raring and is being considered for the [facility] kidney transplant list.  He presents tonight for evaluation of pain in the left flank which has been more or less constant for him for 20 years although it comes and goes.  It has been a little bit worse recently and he has been seeing his nephrologist.  They have been trying to work out the process of getting an outpatient CT scan but has had some difficulty doing so due to insurance issues.  However he reports that he thinks he is scheduled for one tomorrow.   He came in tonight because he started to feel little bit lightheaded and dizzy with some blurred vision.  This is happened to him before when his blood pressure has been up.  He checked his blood pressure at home and it was greater than 191 systolic and 478 diastolic.  He came to the emergency department for evaluation.  He has had similar issues in the past as well as issues with anxiety.  He has been eating and drinking appropriately.  He has been taking both his losartan and his amlodipine for his hypertension.  He has had some nausea but no vomiting.  His symptoms have improved with no specific intervention, and he has currently asymptomatic other than the pain that he is used to in the left flank.  Of note, he has been taking antibiotics for about 2 weeks as prescribed by his nephrologist given concern for possible kidney infection.  He does not remember the name of the antibiotic.  He said that he has noticed his urine has appeared cloudy.  He finished the antibiotics about a week ago and has not been taking them for about a week.     Physical  Exam   Triage Vital Signs: ED Triage Vitals  Enc Vitals Group     BP 09/27/21 2001 (!) 175/99     Pulse Rate 09/27/21 2001 (!) 115     Resp 09/27/21 2001 20     Temp 09/27/21 2001 99.1 F (37.3 C)     Temp Source 09/27/21 2001 Oral     SpO2 09/27/21 2001 95 %     Weight 09/27/21 2002 99.8 kg (220 lb)     Height --      Head Circumference --      Peak Flow --      Pain Score 09/27/21 2002 10     Pain Loc --      Pain Edu? --      Excl. in [place]? --     Most recent vital signs: Vitals:   09/28/21 0000 09/28/21 0010  BP: (!) 149/93   Pulse: 87   Resp: 19   Temp:  97.6 F (36.4 C)  SpO2: 95%      General: Awake, no distress.  CV:  Good peripheral perfusion.  Resp:  Normal effort.  Abd:  No distention.  No tenderness to palpation of the abdomen.  Some left flank tenderness to percussion that he says is normal. Other:  Patient is a little bit anxious but generally normal and appropriate under  the circumstances.   ED Results / Procedures / Treatments   Labs (all labs ordered are listed, but only abnormal results are displayed) Labs Reviewed  CBC WITH DIFFERENTIAL/PLATELET - Abnormal; Notable for the following components:      Result Value   Hemoglobin 12.6 (*)    All other components within normal limits  COMPREHENSIVE METABOLIC PANEL - Abnormal; Notable for the following components:   Glucose, Bld 330 (*)    BUN 41 (*)    Creatinine, Ser 3.25 (*)    AST 14 (*)    GFR, Estimated 22 (*)    All other components within normal limits  URINALYSIS, ROUTINE W REFLEX MICROSCOPIC - Abnormal; Notable for the following components:   Color, Urine STRAW (*)    APPearance HAZY (*)    Specific Gravity, Urine 1.003 (*)    Glucose, UA >=500 (*)    Protein, ur 100 (*)    Leukocytes,Ua LARGE (*)    WBC, UA >50 (*)    All other components within normal limits  SARS CORONAVIRUS 2 BY RT PCR  URINE CULTURE  TROPONIN I (HIGH SENSITIVITY)  TROPONIN I (HIGH SENSITIVITY)      EKG  ED ECG REPORT I, Hinda Kehr, the attending physician, personally viewed and interpreted this ECG.  Date: 09/27/2021 EKG Time: 20: 06 Rate: 105 Rhythm: Sinus tachycardia QRS Axis: normal Intervals: normal ST/T Wave abnormalities: Non-specific ST segment / T-wave changes, but no clear evidence of acute ischemia. Narrative Interpretation: no definitive evidence of acute ischemia; does not meet STEMI criteria.    RADIOLOGY I viewed and interpreted the patient's two-view chest x-ray and I see no evidence of pneumonia nor other acute abnormality.  I also read the radiologist's report, which confirmed no acute findings.  See hospital course for details but I also viewed and interpreted the patient's CT of the abdomen and pelvis.  No acute abnormalities.    PROCEDURES:  Critical Care performed: No  Procedures   MEDICATIONS ORDERED IN ED: Medications  cephALEXin (KEFLEX) capsule 500 mg (has no administration in time range)     IMPRESSION / MDM / ASSESSMENT AND PLAN / ED COURSE  I reviewed the triage vital signs and the nursing notes.                              Differential diagnosis includes, but is not limited to, acute on chronic polycystic kidney disease likely with ruptured cyst (retroperitoneal bleed), infected renal cyst, UTI/pyelonephritis, less likely STD  Patient's presentation is most consistent with acute presentation with potential threat to life or bodily function.  Labs/studies ordered: COVID-19 PCR, urinalysis, CBC with differential, CMP, high-sensitivity troponin.  Also ordered urine culture.  Two-view chest x-ray also ordered with no acute abnormalities.  Results are notable for stable creatinine from prior at 3.25 with a GFR of 22.  This is up very slightly but not substantially from previous test months ago.  CBC with differential is normal including an essentially normal hemoglobin and no leukocytosis.  Urinalysis is notable for large  leukocytes with greater than 50 WBCs but no bacteria seen but there is the presence of WBC clumps which is highly suggestive of infection, particularly given that the patient has recently been on Bactrim DS and has only been off of it 1 to 2 weeks.  I reviewed the medical record, most specific and helpful was the nephrology office note by Dr. Holley Raring on 09/07/2021.  It mentioned that his kidney function has been worsening and that his GFR was down to about 20; in comparison today is 22, indicating that he has at least been relatively stable for the last few weeks.  Of note, Dr. Holley Raring mentioned the patient's flank pain that has not been going on for a couple of weeks with some hematuria and that the patient was started on Bactrim DS 1 tablet daily for the next 7 days, meaning that he was on antibiotics for a week but has been off of it for close to 2 weeks and last there was an extension of the antibiotic therapy that is not documented.  Dr. Holley Raring also mentioned obtaining a CT of the abdomen and pelvis without IV contrast for further evaluation, but this is the scan that has as yet not been obtained.  I had a conversation with the patient and since Dr. Holley Raring tried to schedule a CT abdomen/pelvis without contrast a couple of weeks ago but they have been having insurance difficulties, and the patient is still symptomatic, now with more pyuria, we will proceed with the CT scan given the possibility this could represent an emergent situation.  I think it is most likely that he will be able to get started on antibiotics, likely changing to a cephalosporin given that he is having increasing pyuria in spite of recent treatment with Bactrim DS.  However he has not systemically ill and does not meet criteria for sepsis.  Patient agrees with the plan.  I believe that his hypertension was secondary to the other issues including his anxiety and his blood pressure has come down to acceptable levels.  His tachycardia that was  present initially also resolved and his heart rate is now about 80.    Clinical Course as of 09/28/21 0019  Sun Sep 27, 2021  2358 Of note, high-sensitivity troponin has been normal x2 [CF]  Mon Sep 28, 2021  0008 CT ABDOMEN PELVIS WO CONTRAST I viewed and interpreted the patient's CT abdomen/pelvis.  I see no obvious acute abnormality such as any suggestion of retroperitoneal bleed.  I also read the radiologist's report, which confirmed no acute findings. [CF]  0011 I ordered an initial dose of cephalexin 500 mg p.o.  I will treat as an outpatient as a complicated UTI with cefadroxil.  Normally the dose would be 1 g p.o. twice daily for about 10 days, but given his highly decreased kidney function with a GFR of 22 and a creatinine clearance of about 31-37 (depending on the equation used), I will use the recommended renal dosing of 500 mg every 12 hours.  I am sending a note to Dr. Holley Raring through [facility] so that he is aware of the patient's visit to the emergency department and knows that the CT scan is done.  I updated the patient with the plan and he agrees with the plan for outpatient follow-up.  I gave my usual and customary return precautions. [CF]    Clinical Course User Index [CF] Hinda Kehr, MD     FINAL CLINICAL IMPRESSION(S) / ED DIAGNOSES   Final diagnoses:  Left flank pain  Pyuria  Stage 4 chronic kidney disease ([place])     Rx / DC Orders   ED Discharge Orders          Ordered    cefadroxil (DURICEF) 500 MG capsule  2 times daily        09/28/21 0015  Note:  This document was prepared using Dragon voice recognition software and may include unintentional dictation errors.   Hinda Kehr, MD 09/28/21 [phone]

## 2021-09-27 NOTE — ED Triage Notes (Signed)
Pt arrives with c/o hypertension that started today. Pt endorses blurry vision, SOB, and lightheadedness.

## 2021-09-27 NOTE — ED Notes (Signed)
Lab called to add troponin 

## 2021-09-28 ENCOUNTER — Ambulatory Visit: Admission: RE | Admit: 2021-09-28 | Payer: Managed Care, Other (non HMO) | Source: Ambulatory Visit

## 2021-09-28 MED ORDER — CEFADROXIL 500 MG PO CAPS: 500.0000 mg | ORAL_CAPSULE | Freq: Two times a day (BID) | ORAL | 0 refills | Status: AC

## 2021-09-28 MED ORDER — CEPHALEXIN 500 MG PO CAPS
500.0000 mg | ORAL_CAPSULE | Freq: Once | ORAL | Status: AC
  Administered 2021-09-28: 500 mg via ORAL
  Filled 2021-09-28: qty 1

## 2021-09-28 NOTE — Discharge Instructions (Addendum)
As we discussed, there is no indication anything new or different is happening with your kidneys, and less you have a new or persistent infection.  We started you on a new antibiotic.  We also sent a message to Dr. Holley Raring to let him know about your visit tonight, the CT scan results, and the antibiotics.  Please reach out to his office, although they may contact you first, to schedule a follow-up appointment.    Return to the emergency department if you develop new or worsening symptoms that concern you.

## 2021-09-29 LAB — URINE CULTURE: Culture: <10000 — AB

## 2021-11-19 ENCOUNTER — Other Ambulatory Visit: Payer: Self-pay

## 2021-11-19 ENCOUNTER — Emergency Department: Payer: No Typology Code available for payment source

## 2021-11-19 ENCOUNTER — Inpatient Hospital Stay
Admission: EM | Admit: 2021-11-19 | Discharge: 2021-11-23 | DRG: 872 | Disposition: A | Discharge status: Home / Self Care | Payer: No Typology Code available for payment source | Attending: Internal Medicine | Admitting: Internal Medicine

## 2021-11-19 DIAGNOSIS — E669 Obesity, unspecified: Secondary | ICD-10-CM | POA: Diagnosis present

## 2021-11-19 DIAGNOSIS — N12 Tubulo-interstitial nephritis, not specified as acute or chronic: Principal | ICD-10-CM | POA: Diagnosis present

## 2021-11-19 DIAGNOSIS — A419 Sepsis, unspecified organism: Secondary | ICD-10-CM | POA: Diagnosis present

## 2021-11-19 DIAGNOSIS — F419 Anxiety disorder, unspecified: Secondary | ICD-10-CM | POA: Diagnosis present

## 2021-11-19 DIAGNOSIS — F4310 Post-traumatic stress disorder, unspecified: Secondary | ICD-10-CM | POA: Diagnosis present

## 2021-11-19 DIAGNOSIS — E875 Hyperkalemia: Secondary | ICD-10-CM | POA: Diagnosis present

## 2021-11-19 DIAGNOSIS — N2889 Other specified disorders of kidney and ureter: Secondary | ICD-10-CM | POA: Diagnosis present

## 2021-11-19 DIAGNOSIS — N179 Acute kidney failure, unspecified: Secondary | ICD-10-CM | POA: Diagnosis present

## 2021-11-19 DIAGNOSIS — B9620 Unspecified Escherichia coli [E. coli] as the cause of diseases classified elsewhere: Secondary | ICD-10-CM | POA: Diagnosis present

## 2021-11-19 DIAGNOSIS — N10 Acute tubulo-interstitial nephritis: Secondary | ICD-10-CM | POA: Diagnosis present

## 2021-11-19 DIAGNOSIS — R059 Cough, unspecified: Secondary | ICD-10-CM | POA: Diagnosis present

## 2021-11-19 DIAGNOSIS — R310 Gross hematuria: Secondary | ICD-10-CM | POA: Diagnosis not present

## 2021-11-19 DIAGNOSIS — N184 Chronic kidney disease, stage 4 (severe): Secondary | ICD-10-CM | POA: Diagnosis present

## 2021-11-19 DIAGNOSIS — I151 Hypertension secondary to other renal disorders: Secondary | ICD-10-CM | POA: Diagnosis present

## 2021-11-19 DIAGNOSIS — N289 Disorder of kidney and ureter, unspecified: Secondary | ICD-10-CM | POA: Diagnosis not present

## 2021-11-19 DIAGNOSIS — Z79899 Other long term (current) drug therapy: Secondary | ICD-10-CM | POA: Diagnosis not present

## 2021-11-19 DIAGNOSIS — Z8249 Family history of ischemic heart disease and other diseases of the circulatory system: Secondary | ICD-10-CM | POA: Diagnosis not present

## 2021-11-19 DIAGNOSIS — Z794 Long term (current) use of insulin: Secondary | ICD-10-CM | POA: Diagnosis not present

## 2021-11-19 DIAGNOSIS — Z87891 Personal history of nicotine dependence: Secondary | ICD-10-CM

## 2021-11-19 DIAGNOSIS — E1122 Type 2 diabetes mellitus with diabetic chronic kidney disease: Secondary | ICD-10-CM | POA: Diagnosis present

## 2021-11-19 DIAGNOSIS — R6520 Severe sepsis without septic shock: Secondary | ICD-10-CM | POA: Diagnosis present

## 2021-11-19 DIAGNOSIS — Q613 Polycystic kidney, unspecified: Secondary | ICD-10-CM | POA: Diagnosis not present

## 2021-11-19 DIAGNOSIS — A4151 Sepsis due to Escherichia coli [E. coli]: Secondary | ICD-10-CM | POA: Diagnosis not present

## 2021-11-19 DIAGNOSIS — I10 Essential (primary) hypertension: Secondary | ICD-10-CM

## 2021-11-19 DIAGNOSIS — Z20822 Contact with and (suspected) exposure to covid-19: Secondary | ICD-10-CM | POA: Diagnosis present

## 2021-11-19 DIAGNOSIS — Z6832 Body mass index (BMI) 32.0-32.9, adult: Secondary | ICD-10-CM

## 2021-11-19 DIAGNOSIS — E1129 Type 2 diabetes mellitus with other diabetic kidney complication: Secondary | ICD-10-CM | POA: Diagnosis present

## 2021-11-19 DIAGNOSIS — Z7982 Long term (current) use of aspirin: Secondary | ICD-10-CM | POA: Diagnosis not present

## 2021-11-19 HISTORY — DX: Chronic kidney disease, stage 4 (severe): N18.4

## 2021-11-19 HISTORY — DX: Type 2 diabetes mellitus with other diabetic kidney complication: E11.29

## 2021-11-19 HISTORY — DX: Essential (primary) hypertension: I10

## 2021-11-19 LAB — COMPREHENSIVE METABOLIC PANEL
ALT: 19 U/L (ref 0–44)
AST: 12 U/L — ABNORMAL LOW (ref 15–41)
Albumin: 3.6 g/dL (ref 3.5–5.0)
Alkaline Phosphatase: 62 U/L (ref 38–126)
Anion gap: 9 (ref 5–15)
BUN: 41 mg/dL — ABNORMAL HIGH (ref 6–20)
CO2: 18 mmol/L — ABNORMAL LOW (ref 22–32)
Calcium: 9 mg/dL (ref 8.9–10.3)
Chloride: 107 mmol/L (ref 98–111)
Creatinine, Ser: 3.68 mg/dL — ABNORMAL HIGH (ref 0.61–1.24)
GFR, Estimated: 19 mL/min — ABNORMAL LOW (ref >60)
Glucose, Bld: 221 mg/dL — ABNORMAL HIGH (ref 70–99)
Potassium: 6.2 mmol/L — ABNORMAL HIGH (ref 3.5–5.1)
Sodium: 134 mmol/L — ABNORMAL LOW (ref 135–145)
Total Bilirubin: 1.1 mg/dL (ref 0.3–1.2)
Total Protein: 7.9 g/dL (ref 6.5–8.1)

## 2021-11-19 LAB — CBC WITH DIFFERENTIAL/PLATELET
Abs Immature Granulocytes: 0.14 10*3/uL — ABNORMAL HIGH (ref 0.00–0.07)
Basophils Absolute: 0.1 10*3/uL (ref 0.0–0.1)
Basophils Relative: 0 %
Eosinophils Absolute: 0 10*3/uL (ref 0.0–0.5)
Eosinophils Relative: 0 %
HCT: 37.3 % — ABNORMAL LOW (ref 39.0–52.0)
Hemoglobin: 12.1 g/dL — ABNORMAL LOW (ref 13.0–17.0)
Immature Granulocytes: 1 %
Lymphocytes Relative: 8 %
Lymphs Abs: 1.4 10*3/uL (ref 0.7–4.0)
MCH: 28 pg (ref 26.0–34.0)
MCHC: 32.4 g/dL (ref 30.0–36.0)
MCV: 86.3 fL (ref 80.0–100.0)
Monocytes Absolute: 2.1 10*3/uL — ABNORMAL HIGH (ref 0.1–1.0)
Monocytes Relative: 11 %
Neutro Abs: 15 10*3/uL — ABNORMAL HIGH (ref 1.7–7.7)
Neutrophils Relative %: 80 %
Platelets: 386 10*3/uL (ref 150–400)
RBC: 4.32 MIL/uL (ref 4.22–5.81)
RDW: 13.3 % (ref 11.5–15.5)
WBC: 18.8 10*3/uL — ABNORMAL HIGH (ref 4.0–10.5)
nRBC: 0 % (ref 0.0–0.2)

## 2021-11-19 LAB — URINALYSIS, ROUTINE W REFLEX MICROSCOPIC
Bilirubin Urine: NEGATIVE
Glucose, UA: 50 mg/dL — AB
Ketones, ur: NEGATIVE mg/dL
Nitrite: POSITIVE — AB
Protein, ur: >=300 mg/dL — AB
RBC / HPF: >50 RBC/hpf — ABNORMAL HIGH (ref 0–5)
Specific Gravity, Urine: 1.013 (ref 1.005–1.030)
Squamous Epithelial / HPF: NONE SEEN (ref 0–5)
WBC, UA: >50 WBC/hpf — ABNORMAL HIGH (ref 0–5)
pH: 5 (ref 5.0–8.0)

## 2021-11-19 LAB — RESP PANEL BY RT-PCR (FLU A&B, COVID) ARPGX2
Influenza A by PCR: NEGATIVE
Influenza B by PCR: NEGATIVE
SARS Coronavirus 2 by RT PCR: NEGATIVE

## 2021-11-19 LAB — PROTIME-INR
INR: 1.2 (ref 0.8–1.2)
Prothrombin Time: 14.6 seconds (ref 11.4–15.2)

## 2021-11-19 LAB — GLUCOSE, CAPILLARY
Glucose-Capillary: 152 mg/dL — ABNORMAL HIGH (ref 70–99)
Glucose-Capillary: 213 mg/dL — ABNORMAL HIGH (ref 70–99)
Glucose-Capillary: 149 mg/dL — ABNORMAL HIGH (ref 70–99)
Glucose-Capillary: 190 mg/dL — ABNORMAL HIGH (ref 70–99)

## 2021-11-19 LAB — LACTIC ACID, PLASMA: Lactic Acid, Venous: 1 mmol/L (ref 0.5–1.9)

## 2021-11-19 LAB — PROCALCITONIN: Procalcitonin: 1.26 ng/mL

## 2021-11-19 MED ORDER — DM-GUAIFENESIN ER 30-600 MG PO TB12
1.0000 | ORAL_TABLET | Freq: Two times a day (BID) | ORAL | Status: DC | PRN
  Administered 2021-11-20 – 2021-11-22 (×3): 1 via ORAL
  Filled 2021-11-19: qty 1
  Filled 2021-11-19: qty 2
  Filled 2021-11-19: qty 1

## 2021-11-19 MED ORDER — DEXTROSE 50 % IV SOLN
50.0000 mL | Freq: Once | INTRAVENOUS | Status: AC
  Administered 2021-11-19: 50 mL via INTRAVENOUS
  Filled 2021-11-19: qty 50

## 2021-11-19 MED ORDER — HYDRALAZINE HCL 20 MG/ML IJ SOLN
5.0000 mg | INTRAMUSCULAR | Status: DC | PRN
  Administered 2021-11-21: 5 mg via INTRAVENOUS
  Filled 2021-11-19: qty 1

## 2021-11-19 MED ORDER — ONDANSETRON HCL 4 MG/2ML IJ SOLN
4.0000 mg | Freq: Three times a day (TID) | INTRAMUSCULAR | Status: DC | PRN
  Administered 2021-11-20 – 2021-11-22 (×6): 4 mg via INTRAVENOUS
  Filled 2021-11-19 (×6): qty 2

## 2021-11-19 MED ORDER — SODIUM CHLORIDE 0.9 % IV SOLN: INTRAVENOUS | Status: DC

## 2021-11-19 MED ORDER — MORPHINE SULFATE (PF) 2 MG/ML IV SOLN: 2.0000 mg | INTRAVENOUS | Status: DC | PRN

## 2021-11-19 MED ORDER — INSULIN ASPART 100 UNIT/ML IJ SOLN
0.0000 [IU] | Freq: Three times a day (TID) | INTRAMUSCULAR | Status: DC
  Administered 2021-11-19: 1 [IU] via SUBCUTANEOUS
  Administered 2021-11-20 (×2): 2 [IU] via SUBCUTANEOUS
  Administered 2021-11-20: 3 [IU] via SUBCUTANEOUS
  Administered 2021-11-21: 2 [IU] via SUBCUTANEOUS
  Administered 2021-11-21: 1 [IU] via SUBCUTANEOUS
  Administered 2021-11-21 – 2021-11-22 (×2): 2 [IU] via SUBCUTANEOUS
  Administered 2021-11-22 (×2): 1 [IU] via SUBCUTANEOUS
  Administered 2021-11-23: 2 [IU] via SUBCUTANEOUS
  Filled 2021-11-19 (×11): qty 1

## 2021-11-19 MED ORDER — SODIUM CHLORIDE 0.9 % IV SOLN
2.0000 g | Freq: Once | INTRAVENOUS | Status: AC
  Administered 2021-11-19: 2 g via INTRAVENOUS
  Filled 2021-11-19: qty 12.5

## 2021-11-19 MED ORDER — ALBUTEROL SULFATE (2.5 MG/3ML) 0.083% IN NEBU: 3.0000 mL | INHALATION_SOLUTION | Freq: Four times a day (QID) | RESPIRATORY_TRACT | Status: DC | PRN

## 2021-11-19 MED ORDER — INSULIN ASPART 100 UNIT/ML IV SOLN
10.0000 [IU] | Freq: Once | INTRAVENOUS | Status: AC
  Administered 2021-11-19: 10 [IU] via INTRAVENOUS
  Filled 2021-11-19: qty 0.1

## 2021-11-19 MED ORDER — HYDROMORPHONE HCL 1 MG/ML IJ SOLN
1.0000 mg | INTRAMUSCULAR | Status: DC | PRN
  Administered 2021-11-19 – 2021-11-21 (×8): 1 mg via INTRAVENOUS
  Filled 2021-11-19 (×8): qty 1

## 2021-11-19 MED ORDER — ACETAMINOPHEN 325 MG PO TABS
650.0000 mg | ORAL_TABLET | Freq: Four times a day (QID) | ORAL | Status: DC | PRN
  Administered 2021-11-19 – 2021-11-20 (×3): 650 mg via ORAL
  Filled 2021-11-19 (×3): qty 2

## 2021-11-19 MED ORDER — SODIUM CHLORIDE 0.9 % IV SOLN
2.0000 g | INTRAVENOUS | Status: DC
  Administered 2021-11-20: 2 g via INTRAVENOUS
  Filled 2021-11-19: qty 2

## 2021-11-19 MED ORDER — LACTATED RINGERS IV BOLUS: 1000.0000 mL | Freq: Once | INTRAVENOUS | Status: DC

## 2021-11-19 MED ORDER — HYDROCODONE-ACETAMINOPHEN 5-325 MG PO TABS
1.0000 | ORAL_TABLET | Freq: Four times a day (QID) | ORAL | Status: DC | PRN
  Filled 2021-11-19: qty 1

## 2021-11-19 MED ORDER — SODIUM ZIRCONIUM CYCLOSILICATE 10 G PO PACK
10.0000 g | PACK | ORAL | Status: AC
  Administered 2021-11-19: 10 g via ORAL
  Filled 2021-11-19: qty 1

## 2021-11-19 MED ORDER — CALCIUM GLUCONATE 10 % IV SOLN
1.0000 g | Freq: Once | INTRAVENOUS | Status: AC
  Administered 2021-11-19: 1 g via INTRAVENOUS
  Filled 2021-11-19: qty 10

## 2021-11-19 MED ORDER — ONDANSETRON HCL 4 MG/2ML IJ SOLN
4.0000 mg | Freq: Once | INTRAMUSCULAR | Status: AC
  Administered 2021-11-19: 4 mg via INTRAVENOUS
  Filled 2021-11-19: qty 2

## 2021-11-19 MED ORDER — INSULIN GLARGINE-YFGN 100 UNIT/ML ~~LOC~~ SOLN
20.0000 [IU] | Freq: Every day | SUBCUTANEOUS | Status: DC
  Administered 2021-11-19 – 2021-11-22 (×4): 20 [IU] via SUBCUTANEOUS
  Filled 2021-11-19 (×4): qty 0.2

## 2021-11-19 MED ORDER — MORPHINE SULFATE (PF) 4 MG/ML IV SOLN
4.0000 mg | Freq: Once | INTRAVENOUS | Status: AC
  Administered 2021-11-19: 4 mg via INTRAVENOUS
  Filled 2021-11-19: qty 1

## 2021-11-19 MED ORDER — SODIUM BICARBONATE 8.4 % IV SOLN
50.0000 meq | Freq: Once | INTRAVENOUS | Status: AC
  Administered 2021-11-19: 50 meq via INTRAVENOUS
  Filled 2021-11-19: qty 50

## 2021-11-19 MED ORDER — INSULIN ASPART 100 UNIT/ML IJ SOLN
0.0000 [IU] | Freq: Every day | INTRAMUSCULAR | Status: DC
  Administered 2021-11-20: 2 [IU] via SUBCUTANEOUS
  Filled 2021-11-19: qty 1

## 2021-11-19 MED ORDER — LACTATED RINGERS IV BOLUS
1000.0000 mL | Freq: Once | INTRAVENOUS | Status: AC
  Administered 2021-11-19: 1000 mL via INTRAVENOUS

## 2021-11-19 MED ORDER — HEPARIN SODIUM (PORCINE) 5000 UNIT/ML IJ SOLN: 5000.0000 [IU] | Freq: Three times a day (TID) | INTRAMUSCULAR | Status: DC

## 2021-11-19 NOTE — ED Triage Notes (Signed)
Pt with flank pain on left side x 2 days, pt c/o fevers and weakness. Pt with hx of stage IV polycystic kidney disease. Pt states his urine is dark, denies pain with urination.

## 2021-11-19 NOTE — Plan of Care (Signed)
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
## 2021-11-19 NOTE — Progress Notes (Signed)
   11/19/21 2046  Assess: MEWS Score  Temp (!) 100.4 F (38 C)  BP (!) 151/87  MAP (mmHg) 105  Pulse Rate (!) 115  Resp 17  SpO2 94 %  O2 Device Room Air  Assess: MEWS Score  MEWS Temp 0  MEWS Systolic 0  MEWS Pulse 2  MEWS RR 0  MEWS LOC 0  MEWS Score 2  MEWS Score Color Yellow  Assess: if the MEWS score is Yellow or Red  Were vital signs taken at a resting state? Yes  Focused Assessment No change from prior assessment  Does the patient meet 2 or more of the SIRS criteria? No  MEWS guidelines implemented *See Row Information* Yes  Take Vital Signs  Increase Vital Sign Frequency  Yellow: Q 2hr X 2 then Q 4hr X 2, if remains yellow, continue Q 4hrs  Escalate  MEWS: Escalate Yellow: discuss with charge nurse/RN and consider discussing with provider and RRT  Notify: Charge Nurse/RN  Name of Charge Nurse/RN Notified Jodie RN  Date Charge Nurse/RN Notified 11/19/21  Time Charge Nurse/RN Notified 2050  Notify: Provider  Provider Name/Title Sharion Settler, NP  Date Provider Notified 11/19/21  Time Provider Notified 2100  Method of Notification Page  Notification Reason Other (Comment) (Yellow Mews protocol initiated. HR 116, temp 100.4)  Provider response No new orders  Date of Provider Response 11/19/21  Time of Provider Response 2120  Assess: SIRS CRITERIA  SIRS Temperature  0  SIRS Pulse 1  SIRS Respirations  0  SIRS WBC 1  SIRS Score Sum  2

## 2021-11-19 NOTE — Consult Note (Signed)
PHARMACY -  BRIEF ANTIBIOTIC NOTE   Pharmacy has received consult(s) for cefepime from an ED provider.  The patient's profile has been reviewed for ht/wt/allergies/indication/available labs.    One time order(s) placed for Cefepime 2g IV x1  Further antibiotics/pharmacy consults should be ordered by admitting physician if indicated.                       Thank you, Darrick Penna 11/19/2021  11:20 AM

## 2021-11-19 NOTE — ED Triage Notes (Signed)
First Nurse Note:  C/O left flank pain x 2 days, worse with urination.  Had similar symptoms last week.  Hx of Stage IV polycystic kidney disease.  Also c/o chills.  States had similar episode last year.

## 2021-11-19 NOTE — ED Provider Notes (Signed)
[facility] Provider Note    Event Date/Time   First MD Initiated Contact with Patient 11/19/21 1144     (approximate)   History   Chief Complaint: Flank Pain   HPI  Jeffery Hobbs is a 51 y.o. male with a history of CKD, polycystic kidney disease, diabetes, hypertension who comes to the ED complaining of left flank pain, fever and chills for the past 2 days, waxing waning, gradual onset.  Denies chest pain or shortness of breath.  1 month ago had hospitalization due to urinary tract infection, reports this feels similar.  Denies palpitations or syncope.     Physical Exam   Triage Vital Signs: ED Triage Vitals  Enc Vitals Group     BP 11/19/21 1006 (!) 157/104     Pulse Rate 11/19/21 1006 (!) 117     Resp 11/19/21 1006 (!) 28     Temp 11/19/21 1006 (!) 100.8 F (38.2 C)     Temp Source 11/19/21 1006 Oral     SpO2 11/19/21 1006 94 %     Weight 11/19/21 1003 219 lb (99.3 kg)     Height 11/19/21 1003 5\' 9"  (1.753 m)     Head Circumference --      Peak Flow --      Pain Score 11/19/21 1003 9     Pain Loc --      Pain Edu? --      Excl. in [place]? --     Most recent vital signs: Vitals:   11/19/21 1006  BP: (!) 157/104  Pulse: (!) 117  Resp: (!) 28  Temp: (!) 100.8 F (38.2 C)  SpO2: 94%    General: Awake, no distress.  CV:  Good peripheral perfusion.  Tachycardia heart rate 115 Resp:  Normal effort. ctab Abd:  No distention. Soft with left abd distension and tenderness along L flank. Not peritoneal. No hernia Other:  No LE edema. No rash   ED Results / Procedures / Treatments   Labs (all labs ordered are listed, but only abnormal results are displayed) Labs Reviewed  COMPREHENSIVE METABOLIC PANEL - Abnormal; Notable for the following components:      Result Value   Sodium 134 (*)    Potassium 6.2 (*)    CO2 18 (*)    Glucose, Bld 221 (*)    BUN 41 (*)    Creatinine, Ser 3.68 (*)    AST 12 (*)    GFR, Estimated 19 (*)    All other  components within normal limits  CBC WITH DIFFERENTIAL/PLATELET - Abnormal; Notable for the following components:   WBC 18.8 (*)    Hemoglobin 12.1 (*)    HCT 37.3 (*)    Neutro Abs 15.0 (*)    Monocytes Absolute 2.1 (*)    Abs Immature Granulocytes 0.14 (*)    All other components within normal limits  CULTURE, BLOOD (ROUTINE X 2)  CULTURE, BLOOD (ROUTINE X 2)  URINE CULTURE  RESP PANEL BY RT-PCR (FLU A&B, COVID) ARPGX2  LACTIC ACID, PLASMA  PROTIME-INR  URINALYSIS, ROUTINE W REFLEX MICROSCOPIC     EKG     RADIOLOGY CT abd/pelvis interpreted by me, negative for ureterolithiasis, SBO, or free air.  Radiology report reviewed, no acute findings.  Chest xray unremarkable.   PROCEDURES:  .Critical Care  Performed by: Carrie Mew, MD Authorized by: Carrie Mew, MD   Critical care provider statement:    Critical care time (minutes):  35  Critical care time was exclusive of:  Separately billable procedures and treating other patients   Critical care was necessary to treat or prevent imminent or life-threatening deterioration of the following conditions:  Sepsis, renal failure and metabolic crisis   Critical care was time spent personally by me on the following activities:  Development of treatment plan with patient or surrogate, discussions with consultants, evaluation of patient's response to treatment, examination of patient, obtaining history from patient or surrogate, ordering and performing treatments and interventions, ordering and review of laboratory studies, ordering and review of radiographic studies, pulse oximetry, re-evaluation of patient's condition and review of old charts   Care discussed with: admitting provider   Comments:           MEDICATIONS ORDERED IN ED: Medications  lactated ringers bolus 1,000 mL (has no administration in time range)  ceFEPIme (MAXIPIME) 2 g in sodium chloride 0.9 % 100 mL IVPB (2 g Intravenous New Bag/Given 11/19/21  1131)  sodium zirconium cyclosilicate (LOKELMA) packet 10 g (has no administration in time range)  calcium gluconate inj 10% (1 g) URGENT USE ONLY! (has no administration in time range)  sodium bicarbonate injection 50 mEq (has no administration in time range)  morphine (PF) 4 MG/ML injection 4 mg (4 mg Intravenous Given 11/19/21 1124)  ondansetron (ZOFRAN) injection 4 mg (4 mg Intravenous Given 11/19/21 1121)     IMPRESSION / MDM / ASSESSMENT AND PLAN / ED COURSE  I reviewed the triage vital signs and the nursing notes.                              Differential diagnosis includes, but is not limited to, cystitis, pyelnephritis, ureterolithiasis / ureteral obstruction, electrolyte abnormality, diverticulitis, SBO  Patient's presentation is most consistent with acute presentation with potential threat to life or bodily function.  Pt p/w left flank pain along with fever, tachycardia, tachypnea, concerning for sepsis.  Blood pressure and lactate are okay, no signs of shock. Labs show wbc of 18, CKD at baseline. Cxr and CT a/p unremarkable. UA pending.   Suspect pyelonephritis. Pt started on cefepime and case d/w hospitalist for further evaluation.       FINAL CLINICAL IMPRESSION(S) / ED DIAGNOSES   Final diagnoses:  Pyelonephritis  Polycystic kidney disease  Sepsis without acute organ dysfunction, due to unspecified organism [facility])  Stage 4 chronic kidney disease ([place])     Rx / DC Orders   ED Discharge Orders     None        Note:  This document was prepared using Dragon voice recognition software and may include unintentional dictation errors.   Carrie Mew, MD 11/19/21 1234

## 2021-11-20 DIAGNOSIS — R059 Cough, unspecified: Secondary | ICD-10-CM | POA: Diagnosis present

## 2021-11-20 DIAGNOSIS — A4151 Sepsis due to Escherichia coli [E. coli]: Secondary | ICD-10-CM | POA: Diagnosis not present

## 2021-11-20 DIAGNOSIS — N10 Acute tubulo-interstitial nephritis: Secondary | ICD-10-CM

## 2021-11-20 DIAGNOSIS — E669 Obesity, unspecified: Secondary | ICD-10-CM | POA: Diagnosis present

## 2021-11-20 DIAGNOSIS — E875 Hyperkalemia: Secondary | ICD-10-CM

## 2021-11-20 DIAGNOSIS — N184 Chronic kidney disease, stage 4 (severe): Secondary | ICD-10-CM | POA: Diagnosis not present

## 2021-11-20 DIAGNOSIS — N179 Acute kidney failure, unspecified: Secondary | ICD-10-CM

## 2021-11-20 LAB — BASIC METABOLIC PANEL
Anion gap: 7 (ref 5–15)
BUN: 37 mg/dL — ABNORMAL HIGH (ref 6–20)
CO2: 20 mmol/L — ABNORMAL LOW (ref 22–32)
Calcium: 8.6 mg/dL — ABNORMAL LOW (ref 8.9–10.3)
Chloride: 107 mmol/L (ref 98–111)
Creatinine, Ser: 3.76 mg/dL — ABNORMAL HIGH (ref 0.61–1.24)
GFR, Estimated: 18 mL/min — ABNORMAL LOW (ref >60)
Glucose, Bld: 179 mg/dL — ABNORMAL HIGH (ref 70–99)
Potassium: 5.5 mmol/L — ABNORMAL HIGH (ref 3.5–5.1)
Sodium: 134 mmol/L — ABNORMAL LOW (ref 135–145)

## 2021-11-20 LAB — CBC
HCT: 32.5 % — ABNORMAL LOW (ref 39.0–52.0)
Hemoglobin: 10.6 g/dL — ABNORMAL LOW (ref 13.0–17.0)
MCH: 28.1 pg (ref 26.0–34.0)
MCHC: 32.6 g/dL (ref 30.0–36.0)
MCV: 86.2 fL (ref 80.0–100.0)
Platelets: 363 10*3/uL (ref 150–400)
RBC: 3.77 MIL/uL — ABNORMAL LOW (ref 4.22–5.81)
RDW: 13.2 % (ref 11.5–15.5)
WBC: 16.3 10*3/uL — ABNORMAL HIGH (ref 4.0–10.5)
nRBC: 0 % (ref 0.0–0.2)

## 2021-11-20 LAB — GLUCOSE, CAPILLARY
Glucose-Capillary: 178 mg/dL — ABNORMAL HIGH (ref 70–99)
Glucose-Capillary: 227 mg/dL — ABNORMAL HIGH (ref 70–99)
Glucose-Capillary: 154 mg/dL — ABNORMAL HIGH (ref 70–99)
Glucose-Capillary: 203 mg/dL — ABNORMAL HIGH (ref 70–99)

## 2021-11-20 LAB — HIV ANTIBODY (ROUTINE TESTING W REFLEX): HIV Screen 4th Generation wRfx: NONREACTIVE

## 2021-11-20 LAB — HEMOGLOBIN A1C
Hgb A1c MFr Bld: 10.3 % — ABNORMAL HIGH (ref 4.8–5.6)
Mean Plasma Glucose: 248.91 mg/dL

## 2021-11-20 LAB — PROCALCITONIN: Procalcitonin: 1.3 ng/mL

## 2021-11-20 MED ORDER — SODIUM CHLORIDE 0.9 % IV SOLN
2.0000 g | INTRAVENOUS | Status: DC
  Administered 2021-11-21: 2 g via INTRAVENOUS
  Filled 2021-11-20: qty 2
  Filled 2021-11-20: qty 20

## 2021-11-20 MED ORDER — STERILE WATER FOR INJECTION IV SOLN
INTRAVENOUS | Status: DC
  Filled 2021-11-20: qty 1000
  Filled 2021-11-20 (×3): qty 150
  Filled 2021-11-20: qty 1000
  Filled 2021-11-20: qty 150
  Filled 2021-11-20: qty 1000

## 2021-11-20 MED ORDER — ENOXAPARIN SODIUM 60 MG/0.6ML IJ SOSY: 0.5000 mg/kg | PREFILLED_SYRINGE | INTRAMUSCULAR | Status: DC

## 2021-11-20 MED ORDER — ENOXAPARIN SODIUM 40 MG/0.4ML IJ SOSY
40.0000 mg | PREFILLED_SYRINGE | INTRAMUSCULAR | Status: DC
  Administered 2021-11-20 – 2021-11-22 (×3): 40 mg via SUBCUTANEOUS
  Filled 2021-11-20 (×3): qty 0.4

## 2021-11-20 NOTE — Hospital Course (Signed)
Patient is a 51 year old male with past medical history of stage IV chronic kidney disease, polycystic kidney disease, hypertension, diabetes mellitus and obesity who presented to the emergency room on 11/2 with complaints of fever, chills and left flank pain x2 days.  Work-up revealed sepsis secondary to pyelonephritis and patient started on IV fluids and antibiotics.

## 2021-11-20 NOTE — Assessment & Plan Note (Signed)
Meets criteria with BMI greater than 30 ?

## 2021-11-20 NOTE — Progress Notes (Addendum)
[facility] Progress Note  Patient: Jeffery Hobbs    QIO:[id number]  DOA: 11/19/2021    Date of Service: the patient was seen and examined on 11/20/2021  Brief hospital course: Patient is a 51 year old male with past medical history of stage IV chronic kidney disease, polycystic kidney disease, hypertension, diabetes mellitus and obesity who presented to the emergency room on 11/2 with complaints of fever, chills and left flank pain x2 days.  Work-up revealed sepsis secondary to pyelonephritis and patient started on IV fluids and antibiotics.  Assessment and Plan: Assessment and Plan: * Sepsis ([place]) secondary to acute pyelonephritis Patient met criteria for sepsis on admission given renal source, leukocytosis, tachycardia and tachypnea.  Continue IV fluids and antibiotics.  Procalcitonin with little change from previous day.  Urine culture growing out E. coli, sensitivities pending.  When we discussed potential causes for urinary infection, patient states that he frequently pees into water bottles when traveling long distances.  Advised him this may not be the most sanitary thing to do and may be a cause of urinary tract infections.  AKI in the setting of CKD (chronic kidney disease), stage IV (HCC) Continue IV fluids.  Baseline GFR around 20 to, currently at 18.  AKI secondary to sepsis.  Slightly worse today, likely in the setting of Lokelma given for elevated potassium on admission.  Hyperkalemia Secondary to AKI.  Potassium down to 5.5 down from 6.2 the previous day.  Recheck labs in the morning.  Type II diabetes mellitus with renal manifestations (HCC) Monitor blood sugars with insulin coverage and sliding scale.  A1c pending  HTN (hypertension) Monitor fluid input.  Cough Has been going on for several days before admission.  Chest x-ray and lung exam clear.  Suspect likely secondary to diaphragm irritation from pyelonephritis.  Renal lesion_right kidney CT notes questionable  mass arising from right kidney.  Likely cystic, and not urgent, but would recommend MRI with renal protocol once renal function back at baseline.  Obesity (BMI 30-39.9) Meets criteria with BMI greater than 30       Body mass index is 32.49 kg/m.        Consultants: None  Procedures: None  Antimicrobials: IV Rocephin 11/2-present  Code Status: Full code   Subjective: Patient complains a little left flank pain, overall feels better than previous day  Objective: Noted mild tachycardia Vitals:   11/20/21 1139 11/20/21 1503  BP: 139/86 (!) 152/91  Pulse: (!) 108 (!) 110  Resp: 16 16  Temp: 98.7 F (37.1 C) 99.6 F (37.6 C)  SpO2: 95% 95%    Intake/Output Summary (Last 24 hours) at 11/20/2021 1649 Last data filed at 11/20/2021 0758 Gross per 24 hour  Intake 1319.93 ml  Output 800 ml  Net 519.93 ml   Filed Weights   11/19/21 1003 11/20/21 0430  Weight: 99.3 kg 99.8 kg   Body mass index is 32.49 kg/m.  Exam:  General: Alert and oriented x3, no acute distress HEENT: Normocephalic, atraumatic, mucous membranes slightly dry Cardiovascular: Regular rate and rhythm, S1-S2, 2 out of 6 systolic ejection Respiratory: Clear to auscultation bilaterally Abdomen: Soft, obese, nontender, normoactive bowel sounds Musculoskeletal: No clubbing or cyanosis, trace pitting edema Skin: No skin breaks, tears or lesions Psychiatry: Appropriate, no evidence of psychoses Neurology: No focal deficits  Data Reviewed: White blood cell count down to 16.  Noted little change in procalcitonin.  Renal function slightly worse from previous day  Disposition:  Status is: Inpatient Remains inpatient appropriate because:  -  Treatment of pyelonephritis -Awaiting urine sensitivities -Improvement in renal function    Anticipated discharge date: 11/6   Family Communication: Wife at the bedside DVT Prophylaxis: enoxaparin (LOVENOX) injection 40 mg Start: 11/20/21 2200 Place and  maintain sequential compression device Start: 11/19/21 1202    Author: Annita Brod ,MD 11/20/2021 4:49 PM  To reach On-call, see care teams to locate the attending and reach out via [email or web address]. Between 7PM-7AM, please contact night-coverage If you still have difficulty reaching the attending provider, please page the [facility] (Director on Call) for [facility] on amion for assistance.

## 2021-11-20 NOTE — Assessment & Plan Note (Signed)
Secondary to AKI.  Potassium down to 5.5 down from 6.2 the previous day.  Recheck labs in the morning.

## 2021-11-20 NOTE — Progress Notes (Signed)
   11/20/21 1900 11/20/21 1950  Assess: MEWS Score  Temp (!) 102.8 F (39.3 C) 98.8 F (37.1 C)  Assess: MEWS Score  MEWS Temp 2 0  MEWS Systolic 0 0  MEWS Pulse 1 1  MEWS RR 0 0  MEWS LOC 0 0  MEWS Score 3 1  MEWS Score Color Yellow Green  Assess: if the MEWS score is Yellow or Red  Were vital signs taken at a resting state? Yes  --   Focused Assessment No change from prior assessment  --   Does the patient meet 2 or more of the SIRS criteria? Yes  --   Does the patient have a confirmed or suspected source of infection? Yes  --   Provider and Rapid Response Notified? No  --   MEWS guidelines implemented *See Row Information* No, previously yellow, continue vital signs every 4 hours  --   Treat  MEWS Interventions Administered prn meds/treatments  --   Escalate  MEWS: Escalate Yellow: discuss with charge nurse/RN and consider discussing with provider and RRT  --   Notify: Charge Nurse/RN  Name of Charge Nurse/RN Notified Education officer, museum  --   Date Charge Nurse/RN Notified 11/20/21  --   Time Charge Nurse/RN Notified 1959  --   Document  Patient Outcome Stabilized after interventions  --   Progress note created (see row info) Yes  --   Assess: SIRS CRITERIA  SIRS Temperature  1 0  SIRS Pulse 1 1  SIRS Respirations  0 0  SIRS WBC 1 1  SIRS Score Sum  3 2

## 2021-11-20 NOTE — Assessment & Plan Note (Signed)
Patient met criteria for sepsis on admission given renal source, leukocytosis, tachycardia and tachypnea.  Continue IV fluids and antibiotics.  Procalcitonin with little change from previous day.  Urine culture growing out E. coli, sensitivities pending.  When we discussed potential causes for urinary infection, patient states that he frequently pees into water bottles when traveling long distances.  Advised him this may not be the most sanitary thing to do and may be a cause of urinary tract infections.

## 2021-11-20 NOTE — Assessment & Plan Note (Signed)
CT notes questionable mass arising from right kidney.  Likely cystic, and not urgent, but would recommend MRI with renal protocol once renal function back at baseline.

## 2021-11-20 NOTE — Assessment & Plan Note (Addendum)
Continue IV fluids.  Baseline GFR around 20 to, currently at 18.  AKI secondary to sepsis.  Slightly worse today, likely in the setting of Lokelma given for elevated potassium on admission.

## 2021-11-20 NOTE — Assessment & Plan Note (Signed)
Has been going on for several days before admission.  Chest x-ray and lung exam clear.  Suspect likely secondary to diaphragm irritation from pyelonephritis.

## 2021-11-20 NOTE — Assessment & Plan Note (Addendum)
Monitor blood sugars with insulin coverage and sliding scale.  A1c pending

## 2021-11-20 NOTE — Assessment & Plan Note (Signed)
Monitor fluid input.

## 2021-11-21 DIAGNOSIS — A419 Sepsis, unspecified organism: Secondary | ICD-10-CM | POA: Diagnosis not present

## 2021-11-21 DIAGNOSIS — N10 Acute tubulo-interstitial nephritis: Secondary | ICD-10-CM | POA: Diagnosis not present

## 2021-11-21 DIAGNOSIS — R6520 Severe sepsis without septic shock: Secondary | ICD-10-CM

## 2021-11-21 DIAGNOSIS — Q613 Polycystic kidney, unspecified: Secondary | ICD-10-CM | POA: Diagnosis not present

## 2021-11-21 LAB — BASIC METABOLIC PANEL
Anion gap: 8 (ref 5–15)
BUN: 36 mg/dL — ABNORMAL HIGH (ref 6–20)
CO2: 26 mmol/L (ref 22–32)
Calcium: 8.9 mg/dL (ref 8.9–10.3)
Chloride: 103 mmol/L (ref 98–111)
Creatinine, Ser: 3.36 mg/dL — ABNORMAL HIGH (ref 0.61–1.24)
GFR, Estimated: 21 mL/min — ABNORMAL LOW (ref >60)
Glucose, Bld: 148 mg/dL — ABNORMAL HIGH (ref 70–99)
Potassium: 4.8 mmol/L (ref 3.5–5.1)
Sodium: 137 mmol/L (ref 135–145)

## 2021-11-21 LAB — URINE CULTURE: Culture: >=100000 — AB

## 2021-11-21 LAB — GLUCOSE, CAPILLARY
Glucose-Capillary: 176 mg/dL — ABNORMAL HIGH (ref 70–99)
Glucose-Capillary: 180 mg/dL — ABNORMAL HIGH (ref 70–99)
Glucose-Capillary: 125 mg/dL — ABNORMAL HIGH (ref 70–99)
Glucose-Capillary: 177 mg/dL — ABNORMAL HIGH (ref 70–99)

## 2021-11-21 LAB — CBC
HCT: 31.7 % — ABNORMAL LOW (ref 39.0–52.0)
Hemoglobin: 10.5 g/dL — ABNORMAL LOW (ref 13.0–17.0)
MCH: 28.4 pg (ref 26.0–34.0)
MCHC: 33.1 g/dL (ref 30.0–36.0)
MCV: 85.7 fL (ref 80.0–100.0)
Platelets: 366 10*3/uL (ref 150–400)
RBC: 3.7 MIL/uL — ABNORMAL LOW (ref 4.22–5.81)
RDW: 13.2 % (ref 11.5–15.5)
WBC: 12.9 10*3/uL — ABNORMAL HIGH (ref 4.0–10.5)
nRBC: 0 % (ref 0.0–0.2)

## 2021-11-21 NOTE — Progress Notes (Addendum)
  Progress Note   Patient: Jeffery Hobbs [id number] DOB: [birth year] DOA: 11/19/2021     2 DOS: the patient was seen and examined on 11/21/2021   Brief hospital course: Patient is a 51 year old male with past medical history of stage IV chronic kidney disease, polycystic kidney disease, hypertension, diabetes mellitus and obesity who presented to the emergency room on 11/2 with complaints of fever, chills and left flank pain x2 days.  Work-up revealed sepsis secondary to pyelonephritis and patient started on IV fluids and antibiotics.  Assessment and Plan:  Severe sepsis ([place]) secondary to acute pyelonephritis Patient met criteria for sepsis on admission given renal source, leukocytosis, tachycardia and tachypnea.  Patient also has acute renal failure qualify for severe sepsis. Blood culture came back no growth.  Urine culture grew E. coli pansensitive. We will continue Rocephin. Patient still has significant subjective fever and sweating, will keep patient for another day.  Plan discharge home tomorrow if no fever.   AKI in the setting of CKD (chronic kidney disease), stage IV (HCC) Hyperkalemia. Metabolic acidosis. Renal function has active baseline after fluids.  Potassium normalized.  Type II diabetes mellitus with renal manifestations (HCC) Monitor blood sugars with insulin coverage and sliding scale.  A1c pending  HTN (hypertension) Monitor fluid input.  Cough No evidence of pneumonia, condition improved.  Renal lesion_right kidney Polycystic kidney. CT notes questionable mass arising from right kidney.  Likely cystic, and not urgent, but would recommend MRI with renal protocol once renal function back at baseline. I will schedule urology follow-up after discharge.  Obesity (BMI 30-39.9) Meets criteria with BMI greater than 30      Subjective:  Patient still has significant subjective fever, chills. No abdominal pain nausea vomiting.  Physical Exam: Vitals:    11/20/21 2055 11/21/21 0010 11/21/21 0529 11/21/21 0832  BP: (!) 156/88 134/76 (!) 152/93 (!) 158/90  Pulse: (!) 110 100 98 96  Resp: _0 Temp: 99.4 F (37.4 C) 98.4 F (36.9 C) 99.5 F (37.5 C) 98.5 F (36.9 C)  TempSrc: Oral  Oral Oral  SpO2: 91% 94% 95% 95%  Weight:      Height:       General exam: Appears calm and comfortable  Respiratory system: Clear to auscultation. Respiratory effort normal. Cardiovascular system: S1 & S2 heard, RRR. No JVD, murmurs, rubs, gallops or clicks. No pedal edema. Gastrointestinal system: Abdomen is nondistended, soft and nontender. No organomegaly or masses felt. Normal bowel sounds heard. Central nervous system: Alert and oriented. No focal neurological deficits. Extremities: Symmetric 5 x 5 power. Skin: No rashes, lesions or ulcers Psychiatry: Judgement and insight appear normal. Mood & affect appropriate.   Data Reviewed:  Lab results reviewed.  Family Communication: Wife updated at bedside.  Disposition: Status is: Inpatient Remains inpatient appropriate because: Severity of disease, IV antibiotics.  Planned Discharge Destination: Home    Time spent: 35 minutes  Author: Sharen Hones, MD 11/21/2021 1:23 PM  For on call review [email or web address].

## 2021-11-22 ENCOUNTER — Inpatient Hospital Stay: Payer: No Typology Code available for payment source

## 2021-11-22 DIAGNOSIS — R6520 Severe sepsis without septic shock: Secondary | ICD-10-CM | POA: Diagnosis not present

## 2021-11-22 DIAGNOSIS — R310 Gross hematuria: Secondary | ICD-10-CM | POA: Insufficient documentation

## 2021-11-22 DIAGNOSIS — N10 Acute tubulo-interstitial nephritis: Secondary | ICD-10-CM | POA: Diagnosis not present

## 2021-11-22 DIAGNOSIS — A419 Sepsis, unspecified organism: Secondary | ICD-10-CM | POA: Diagnosis not present

## 2021-11-22 DIAGNOSIS — Q613 Polycystic kidney, unspecified: Secondary | ICD-10-CM | POA: Diagnosis not present

## 2021-11-22 DIAGNOSIS — N2889 Other specified disorders of kidney and ureter: Secondary | ICD-10-CM | POA: Diagnosis not present

## 2021-11-22 DIAGNOSIS — N12 Tubulo-interstitial nephritis, not specified as acute or chronic: Secondary | ICD-10-CM | POA: Diagnosis not present

## 2021-11-22 LAB — CBC
HCT: 31.1 % — ABNORMAL LOW (ref 39.0–52.0)
Hemoglobin: 10.2 g/dL — ABNORMAL LOW (ref 13.0–17.0)
MCH: 28.3 pg (ref 26.0–34.0)
MCHC: 32.8 g/dL (ref 30.0–36.0)
MCV: 86.4 fL (ref 80.0–100.0)
Platelets: 390 10*3/uL (ref 150–400)
RBC: 3.6 MIL/uL — ABNORMAL LOW (ref 4.22–5.81)
RDW: 13.1 % (ref 11.5–15.5)
WBC: 8.4 10*3/uL (ref 4.0–10.5)
nRBC: 0 % (ref 0.0–0.2)

## 2021-11-22 LAB — GLUCOSE, CAPILLARY
Glucose-Capillary: 144 mg/dL — ABNORMAL HIGH (ref 70–99)
Glucose-Capillary: 133 mg/dL — ABNORMAL HIGH (ref 70–99)
Glucose-Capillary: 193 mg/dL — ABNORMAL HIGH (ref 70–99)
Glucose-Capillary: 143 mg/dL — ABNORMAL HIGH (ref 70–99)

## 2021-11-22 LAB — BASIC METABOLIC PANEL
Anion gap: 10 (ref 5–15)
BUN: 35 mg/dL — ABNORMAL HIGH (ref 6–20)
CO2: 28 mmol/L (ref 22–32)
Calcium: 8.7 mg/dL — ABNORMAL LOW (ref 8.9–10.3)
Chloride: 100 mmol/L (ref 98–111)
Creatinine, Ser: 3.27 mg/dL — ABNORMAL HIGH (ref 0.61–1.24)
GFR, Estimated: 22 mL/min — ABNORMAL LOW (ref >60)
Glucose, Bld: 125 mg/dL — ABNORMAL HIGH (ref 70–99)
Potassium: 4.1 mmol/L (ref 3.5–5.1)
Sodium: 138 mmol/L (ref 135–145)

## 2021-11-22 LAB — MAGNESIUM: Magnesium: 1.6 mg/dL — ABNORMAL LOW (ref 1.7–2.4)

## 2021-11-22 LAB — SARS CORONAVIRUS 2 BY RT PCR: SARS Coronavirus 2 by RT PCR: NEGATIVE

## 2021-11-22 MED ORDER — CEPHALEXIN 500 MG PO CAPS
500.0000 mg | ORAL_CAPSULE | Freq: Three times a day (TID) | ORAL | Status: DC
  Administered 2021-11-22 – 2021-11-23 (×4): 500 mg via ORAL
  Filled 2021-11-22 (×4): qty 1

## 2021-11-22 MED ORDER — MAGNESIUM SULFATE 2 GM/50ML IV SOLN
2.0000 g | Freq: Once | INTRAVENOUS | Status: AC
  Administered 2021-11-22: 2 g via INTRAVENOUS
  Filled 2021-11-22: qty 50

## 2021-11-22 MED ORDER — MOMETASONE FURO-FORMOTEROL FUM 100-5 MCG/ACT IN AERO
2.0000 | INHALATION_SPRAY | Freq: Two times a day (BID) | RESPIRATORY_TRACT | Status: DC
  Administered 2021-11-22 – 2021-11-23 (×2): 2 via RESPIRATORY_TRACT
  Filled 2021-11-22: qty 8.8

## 2021-11-22 NOTE — Plan of Care (Signed)
Pt alert and oriented x 4. Pt received 1 dose of dilaudid and zofran. Pt afebrile this shift. NO chills reported. BP elevated but not meeting parameters for hydralazine.  Problem: Education: Goal: Ability to describe self-care measures that may prevent or decrease complications (Diabetes Survival Skills Education) will improve Outcome: Progressing Goal: Individualized Educational Video(s) Outcome: Progressing   Problem: Coping: Goal: Ability to adjust to condition or change in health will improve Outcome: Progressing   Problem: Fluid Volume: Goal: Ability to maintain a balanced intake and output will improve Outcome: Progressing   Problem: Health Behavior/Discharge Planning: Goal: Ability to identify and utilize available resources and services will improve Outcome: Progressing Goal: Ability to manage health-related needs will improve Outcome: Progressing   Problem: Metabolic: Goal: Ability to maintain appropriate glucose levels will improve Outcome: Progressing   Problem: Nutritional: Goal: Maintenance of adequate nutrition will improve Outcome: Progressing Goal: Progress toward achieving an optimal weight will improve Outcome: Progressing   Problem: Skin Integrity: Goal: Risk for impaired skin integrity will decrease Outcome: Progressing   Problem: Tissue Perfusion: Goal: Adequacy of tissue perfusion will improve Outcome: Progressing   Problem: Education: Goal: Knowledge of General Education information will improve Description: Including pain rating scale, medication(s)/side effects and non-pharmacologic comfort measures Outcome: Progressing   Problem: Health Behavior/Discharge Planning: Goal: Ability to manage health-related needs will improve Outcome: Progressing   Problem: Clinical Measurements: Goal: Ability to maintain clinical measurements within normal limits will improve Outcome: Progressing Goal: Will remain free from infection Outcome:  Progressing Goal: Diagnostic test results will improve Outcome: Progressing Goal: Respiratory complications will improve Outcome: Progressing Goal: Cardiovascular complication will be avoided Outcome: Progressing   Problem: Activity: Goal: Risk for activity intolerance will decrease Outcome: Progressing   Problem: Nutrition: Goal: Adequate nutrition will be maintained Outcome: Progressing   Problem: Coping: Goal: Level of anxiety will decrease Outcome: Progressing   Problem: Elimination: Goal: Will not experience complications related to bowel motility Outcome: Progressing Goal: Will not experience complications related to urinary retention Outcome: Progressing   Problem: Pain Managment: Goal: General experience of comfort will improve Outcome: Progressing   Problem: Safety: Goal: Ability to remain free from injury will improve Outcome: Progressing   Problem: Skin Integrity: Goal: Risk for impaired skin integrity will decrease Outcome: Progressing
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
## 2021-11-22 NOTE — Progress Notes (Signed)
  Progress Note   Patient: Jeffery Hobbs [id number]:[id number] DOB: [birth year] DOA: 11/19/2021     3 DOS: the patient was seen and examined on 11/22/2021   Brief hospital course: Patient is a 51 year old male with past medical history of stage IV chronic kidney disease, polycystic kidney disease, hypertension, diabetes mellitus and obesity who presented to the emergency room on 11/2 with complaints of fever, chills and left flank pain x2 days.  Work-up revealed sepsis secondary to pyelonephritis and patient started on IV fluids and antibiotics.  Assessment and Plan: Severe sepsis ([place]) secondary to acute pyelonephritis Patient met criteria for sepsis on admission given renal source, leukocytosis, tachycardia and tachypnea.  Patient also has acute renal failure qualify for severe sepsis. Blood culture came back no growth.  Urine culture grew E. coli pansensitive. No additional fever today.  Antibiotic changed to Keflex.  Gross hematuria.  Renal lesion_right kidney Polycystic kidney. CT notes questionable mass arising from right kidney.  Likely cystic, and not urgent, but would recommend MRI with renal protocol once renal function back at baseline. Patient developed gross hematuria today, this could be due to polycystic kidney disease versus pyelonephritis.  However, patient has a questionable renal mass.  I will obtain urology consult as inpatient.   AKI in the setting of CKD (chronic kidney disease), stage IV (HCC) Hyperkalemia. Metabolic acidosis. Hypomagnesemia. Renal function has active baseline after fluids.  Potassium normalized. Give 2 g of magnesium sulfate for magnesium 1.6.  Type II diabetes mellitus with renal manifestations (HCC)  A1c 10.3.  Glucose not currently elevated.  Continue sliding scale insulin.  HTN (hypertension) Continue to monitor.   Cough Patient has worsening cough today, discontinue IV fluids.  Continue as needed albuterol.  Chest x-ray today did not show any  significant volume overload versus pneumonia.  Repeat COVID negative.      Obesity (BMI 30-39.9) Meets criteria with BMI greater than 30      Subjective:  Patient did develop gross hematuria today, no dysuria. Patient also still has cough, nonproductive.  Some shortness of breath.  Physical Exam: Vitals:   11/21/21 1809 11/21/21 1917 11/22/21 0508 11/22/21 0732  BP: (!) 157/79 (!) 160/87 (!) 161/96 (!) 154/92  Pulse: (!) 104 (!) 102 87 86  Resp:  _0 Temp:  99.4 F (37.4 C) 98.9 F (37.2 C) 98.5 F (36.9 C)  TempSrc:  Oral Oral Oral  SpO2:  94% 95% 93%  Weight:   100.2 kg   Height:       General exam: Appears calm and comfortable  Respiratory system: Clear to auscultation. Respiratory effort normal. Cardiovascular system: S1 & S2 heard, RRR. No JVD, murmurs, rubs, gallops or clicks. No pedal edema. Gastrointestinal system: Abdomen is nondistended, soft and nontender. No organomegaly or masses felt. Normal bowel sounds heard. Central nervous system: Alert and oriented. No focal neurological deficits. Extremities: Symmetric 5 x 5 power. Skin: No rashes, lesions or ulcers Psychiatry: Judgement and insight appear normal. Mood & affect appropriate.   Data Reviewed:  Chest x-ray and lab results reviewed.  Family Communication: Wife updated at the bedside.  Disposition: Status is: Inpatient Remains inpatient appropriate because: Severity of disease.  Planned Discharge Destination: Home with Home Health    Time spent: 50 minutes  Author: Sharen Hones, MD 11/22/2021 3:14 PM  For on call review [email or web address].

## 2021-11-23 DIAGNOSIS — R6520 Severe sepsis without septic shock: Secondary | ICD-10-CM | POA: Diagnosis not present

## 2021-11-23 DIAGNOSIS — A419 Sepsis, unspecified organism: Secondary | ICD-10-CM | POA: Diagnosis not present

## 2021-11-23 DIAGNOSIS — Q613 Polycystic kidney, unspecified: Secondary | ICD-10-CM | POA: Diagnosis not present

## 2021-11-23 DIAGNOSIS — N12 Tubulo-interstitial nephritis, not specified as acute or chronic: Secondary | ICD-10-CM

## 2021-11-23 LAB — CBC
HCT: 37.6 % — ABNORMAL LOW (ref 39.0–52.0)
Hemoglobin: 11.8 g/dL — ABNORMAL LOW (ref 13.0–17.0)
MCH: 27.4 pg (ref 26.0–34.0)
MCHC: 31.4 g/dL (ref 30.0–36.0)
MCV: 87.2 fL (ref 80.0–100.0)
Platelets: 443 10*3/uL — ABNORMAL HIGH (ref 150–400)
RBC: 4.31 MIL/uL (ref 4.22–5.81)
RDW: 13.1 % (ref 11.5–15.5)
WBC: 9.5 10*3/uL (ref 4.0–10.5)
nRBC: 0 % (ref 0.0–0.2)

## 2021-11-23 LAB — MAGNESIUM: Magnesium: 2.1 mg/dL (ref 1.7–2.4)

## 2021-11-23 LAB — GLUCOSE, CAPILLARY: Glucose-Capillary: 200 mg/dL — ABNORMAL HIGH (ref 70–99)

## 2021-11-23 LAB — BASIC METABOLIC PANEL
Anion gap: 11 (ref 5–15)
BUN: 37 mg/dL — ABNORMAL HIGH (ref 6–20)
CO2: 26 mmol/L (ref 22–32)
Calcium: 9.5 mg/dL (ref 8.9–10.3)
Chloride: 99 mmol/L (ref 98–111)
Creatinine, Ser: 3.33 mg/dL — ABNORMAL HIGH (ref 0.61–1.24)
GFR, Estimated: 21 mL/min — ABNORMAL LOW (ref >60)
Glucose, Bld: 182 mg/dL — ABNORMAL HIGH (ref 70–99)
Potassium: 4.6 mmol/L (ref 3.5–5.1)
Sodium: 136 mmol/L (ref 135–145)

## 2021-11-23 MED ORDER — LIVING WELL WITH DIABETES BOOK
Freq: Once | Status: AC
  Filled 2021-11-23: qty 1

## 2021-11-23 MED ORDER — CEPHALEXIN 500 MG PO CAPS: 500.0000 mg | ORAL_CAPSULE | Freq: Three times a day (TID) | ORAL | 0 refills | Status: AC

## 2021-11-23 MED ORDER — ALBUTEROL SULFATE HFA 108 (90 BASE) MCG/ACT IN AERS: 2.0000 | INHALATION_SPRAY | Freq: Three times a day (TID) | RESPIRATORY_TRACT | 0 refills | Status: AC | PRN

## 2021-11-23 NOTE — TOC Initial Note (Signed)
Transition of Care [facility]) - Initial/Assessment Note    Patient Details  Name: Jeffery Hobbs MRN: [id number] Date of Birth: 1970/11/09  Transition of Care [facility]) CM/SW Contact:    Beverly Sessions, RN Phone Number: 11/23/2021, 9:48 AM  Clinical Narrative:                       Transition of Care [facility]) Screening Note   Patient Details  Name: Jeffery Hobbs Date of Birth: Dec 21, 1970   Transition of Care [facility]) CM/SW Contact:    Beverly Sessions, RN Phone Number: 11/23/2021, 9:48 AM    Transition of Care Department [facility]) has reviewed patient and no TOC needs have been identified at this time. We will continue to monitor patient advancement through interdisciplinary progression rounds. If new patient transition needs arise, please place a TOC consult.     Patient Goals and CMS Choice        Expected Discharge Plan and Services           Expected Discharge Date: 11/23/21                                    Prior Living Arrangements/Services                       Activities of Daily Living Home Assistive Devices/Equipment: None ADL Screening (condition at time of admission) Patient's cognitive ability adequate to safely complete daily activities?: Yes Is the patient deaf or have difficulty hearing?: No Does the patient have difficulty seeing, even when wearing glasses/contacts?: No Does the patient have difficulty concentrating, remembering, or making decisions?: No Patient able to express need for assistance with ADLs?: Yes Does the patient have difficulty dressing or bathing?: No Independently performs ADLs?: Yes (appropriate for developmental age) Does the patient have difficulty walking or climbing stairs?: No Weakness of Legs: None Weakness of Arms/Hands: None  Permission Sought/Granted                  Emotional Assessment              Admission diagnosis:  Pyelonephritis [N12] Polycystic kidney disease [Q61.3] Sepsis ([place])  [A41.9] Stage 4 chronic kidney disease ([place]) [N18.4] Sepsis without acute organ dysfunction, due to unspecified organism [facility]) [A41.9] Patient Active Problem List   Diagnosis Date Noted   Gross hematuria 11/22/2021   Renal mass 11/22/2021   Obesity (BMI 30-39.9) 11/20/2021   Cough 11/20/2021   Severe sepsis ([place]) 11/19/2021   AKI in the setting of CKD (chronic kidney disease), stage IV ([place]) 11/19/2021   Polycystic kidney disease 11/19/2021   Type II diabetes mellitus with renal manifestations ([place]) 11/19/2021   HTN (hypertension) 11/19/2021   Hyperkalemia 11/19/2021   Renal lesion_right kidney 11/19/2021   Pyelonephritis 11/19/2021   Pneumonia due to COVID-19 virus 02/08/2020   PCP:  Kirk Ruths, MD Pharmacy:   [id number] [place] [place] [address] [place] [address] Phone: [phone] Fax: [phone]     Social Determinants of Health (SDOH) Interventions    Readmission Risk Interventions     No data to display

## 2021-11-23 NOTE — Discharge Summary (Signed)
Physician Discharge Summary   Patient: Jeffery Hobbs MRN: [id number] DOB: [birth year]  Admit date:     11/19/2021  Discharge date: 11/23/21  Discharge Physician: Sharen Hones   PCP: Kirk Ruths, MD   Recommendations at discharge:   Follow-up with PCP in 1 week. Follow-up with urology in 1 week. Follow-up with nephrology in 2 weeks.  Discharge Diagnoses: Principal Problem:   Severe sepsis ([place]) Active Problems:   AKI in the setting of CKD (chronic kidney disease), stage IV (HCC)   Pyelonephritis   Hyperkalemia   Type II diabetes mellitus with renal manifestations (HCC)   HTN (hypertension)   Polycystic kidney disease   Renal lesion_right kidney   Cough   Obesity (BMI 30-39.9)   Gross hematuria   Renal mass  Resolved Problems:   * No resolved hospital problems. [facility] Course: Patient is a 51 year old male with past medical history of stage IV chronic kidney disease, polycystic kidney disease, hypertension, diabetes mellitus and obesity who presented to the emergency room on 11/2 with complaints of fever, chills and left flank pain x2 days.  Work-up revealed sepsis secondary to pyelonephritis and patient started on IV fluids and antibiotics. Patient had episodes gross hematuria, seen by urology, deemed to be secondary to polycystic kidney with pyelonephritis.  Patient be followed with urology as outpatient. Assessment and Plan: Severe sepsis ([place]) secondary to acute pyelonephritis Acute pyelonephritis. Patient met criteria for sepsis on admission given renal source, leukocytosis, tachycardia and tachypnea.  Patient also has acute renal failure qualify for severe sepsis. Blood culture came back no growth.  Urine culture grew E. coli pansensitive. Continue to finish 5 more days of Keflex.  Patient to be seen by neurology as outpatient.   Gross hematuria.  Renal lesion_right kidney Polycystic kidney. CT notes questionable mass arising from right kidney.  Likely cystic,  and not urgent, but would recommend MRI with renal protocol once renal function back at baseline. Patient developed gross hematuria today, this could be due to polycystic kidney disease versus pyelonephritis.  Seen by urology, will follow up with them in 1 week.  Gross hematuria has resolved today.  Hemoglobin stable.    AKI in the setting of CKD (chronic kidney disease), stage IV (HCC) Hyperkalemia. Metabolic acidosis. Hypomagnesemia. Renal function has active baseline after fluids.  Potassium normalized. Condition has improved but renal function still stable.  Type II diabetes mellitus with renal manifestations (HCC) Resume home regimen, follow-up with PCP as outpatient.   HTN (hypertension) Continue to monitor.   Cough Cough has improved with albuterol.  No pneumonia or congestive heart failure.  Might due to allergy.       Consultants: Urology Procedures performed: None  Disposition: Home Diet recommendation:  Discharge Diet Orders (From admission, onward)     Start     Ordered   11/23/21 0000  Diet Carb Modified        11/23/21 0945           Cardiac diet DISCHARGE MEDICATION: Allergies as of 11/23/2021   No Known Allergies      Medication List     TAKE these medications    albuterol 108 (90 Base) MCG/ACT inhaler Commonly known as: VENTOLIN HFA Inhale 2 puffs into the lungs every 8 (eight) hours as needed for wheezing or shortness of breath. What changed: reasons to take this   amLODipine 10 MG tablet Commonly known as: NORVASC Take 10 mg by mouth daily.   aspirin EC 81 MG tablet Take  81 mg by mouth daily.   cephALEXin 500 MG capsule Commonly known as: KEFLEX Take 1 capsule (500 mg total) by mouth every 8 (eight) hours for 7 days.   glipiZIDE 10 MG tablet Commonly known as: GLUCOTROL Take 10 mg by mouth daily before breakfast.   insulin glargine 100 UNIT/ML Solostar Pen Commonly known as: LANTUS Inject 35 Units into the skin at bedtime.    losartan 100 MG tablet Commonly known as: COZAAR Take 100 mg by mouth daily.        Follow-up Information     Stoneking, Reece Leader., MD Follow up in 1 week(s).   Specialty: Urology Contact information: [place] [facility] [address] [phone]         Anthonette Legato, MD Follow up in 2 week(s).   Specialty: Nephrology Contact information: [place] [address] [phone]         Kirk Ruths, MD Follow up in 1 week(s).   Specialty: Internal Medicine Contact information: [place] [address] [phone]                Discharge Exam: Danley Danker Weights   11/20/21 0430 11/22/21 0508 11/23/21 0500  Weight: 99.8 kg 100.2 kg 98.9 kg   General exam: Appears calm and comfortable  Respiratory system: Clear to auscultation. Respiratory effort normal. Cardiovascular system: S1 & S2 heard, RRR. No JVD, murmurs, rubs, gallops or clicks. No pedal edema. Gastrointestinal system: Abdomen is nondistended, soft and nontender. No organomegaly or masses felt. Normal bowel sounds heard. Central nervous system: Alert and oriented. No focal neurological deficits. Extremities: Symmetric 5 x 5 power. Skin: No rashes, lesions or ulcers Psychiatry: Judgement and insight appear normal. Mood & affect appropriate.    Condition at discharge: good  The results of significant diagnostics from this hospitalization (including imaging, microbiology, ancillary and laboratory) are listed below for reference.   Imaging Studies: DG Chest 2 View  Result Date: 11/22/2021 CLINICAL DATA:  Shortness of breath. EXAM: CHEST - 2 VIEW COMPARISON:  11/19/2021 FINDINGS: Poor inspiration. No gross change in borderline enlargement of the cardiac silhouette. Bibasilar linear atelectasis. Unremarkable bones. IMPRESSION: Poor inspiration with bibasilar atelectasis. Electronically Signed   By: Claudie Revering M.D.    On: 11/22/2021 11:49   CT Renal Stone Study  Result Date: 11/19/2021 CLINICAL DATA:  Left flank pain. History of polycystic kidney disease EXAM: CT ABDOMEN AND PELVIS WITHOUT CONTRAST TECHNIQUE: Multidetector CT imaging of the abdomen and pelvis was performed following the standard protocol without IV contrast. RADIATION DOSE REDUCTION: This exam was performed according to the departmental dose-optimization program which includes automated exposure control, adjustment of the mA and/or kV according to patient size and/or use of iterative reconstruction technique. COMPARISON:  09/27/2021 FINDINGS: Lower chest: No acute abnormality. Hepatobiliary: Unremarkable unenhanced appearance of the liver. No focal liver lesion identified. Gallbladder within normal limits. No hyperdense gallstone. No biliary dilatation. Pancreas: Unremarkable. No pancreatic ductal dilatation or surrounding inflammatory changes. Spleen: Normal in size without focal abnormality. Adrenals/Urinary Tract: Unremarkable adrenal glands. Redemonstration of innumerable cystic lesions throughout both kidneys of variable size and attenuation compatible with known polycystic kidney disease. A rounded hyperdense exophytic lesion arising from the inferior pole of the right kidney measuring 3.2 cm with internal density of 48 HU (series 2, image 46) has enlarged from 2021 (previously measured 1.4 cm). No renal stone or hydronephrosis. Urinary bladder is decompressed. Stomach/Bowel: Stomach is  within normal limits. No evidence of bowel wall thickening, distention, or inflammatory changes. Vascular/Lymphatic: No significant vascular findings are present. No enlarged abdominal or pelvic lymph nodes. Reproductive: Prostate is unremarkable. Other: No free fluid. No abdominopelvic fluid collection. No pneumoperitoneum. Small fat containing umbilical hernia. Musculoskeletal: No acute or significant osseous findings. IMPRESSION: 1. No acute abdominopelvic findings.  Specifically, no evidence of obstructive uropathy. 2. Redemonstration of innumerable cystic lesions throughout both kidneys of variable size and attenuation compatible with known polycystic kidney disease. 3. A rounded hyperdense exophytic lesion arising from the inferior pole of the right kidney measuring 3.2 cm has enlarged from 2021 (previously measured 1.4 cm). While this may represent a hemorrhagic or proteinaceous cyst, a solid renal mass is not excluded. A non-emergent contrast-enhanced renal protocol MRI is recommended for more definitive characterization. Electronically Signed   By: Davina Poke D.O.   On: 11/19/2021 11:32   DG Chest Port 1 View  Result Date: 11/19/2021 CLINICAL DATA:  Left flank pain for 2 days EXAM: PORTABLE CHEST 1 VIEW COMPARISON:  09/27/2021 FINDINGS: The heart size and mediastinal contours are within normal limits. Both lungs are clear. The visualized skeletal structures are unremarkable. IMPRESSION: No active disease. Electronically Signed   By: Kathreen Devoid M.D.   On: 11/19/2021 11:18    Microbiology: Results for orders placed or performed during the hospital encounter of 11/19/21  Culture, blood (Routine x 2)     Status: None (Preliminary result)   Collection Time: 11/19/21 10:30 AM   Specimen: BLOOD  Result Value Ref Range Status   Specimen Description BLOOD RIGHT ANTECUBITAL  Final   Special Requests   Final    BOTTLES DRAWN AEROBIC AND ANAEROBIC Blood Culture adequate volume   Culture   Final    NO GROWTH 3 DAYS Performed at [facility], [address], [place], [place] [address]    Report Status PENDING  Incomplete  Culture, blood (Routine x 2)     Status: None (Preliminary result)   Collection Time: 11/19/21 10:30 AM   Specimen: BLOOD  Result Value Ref Range Status   Specimen Description   Final    BLOOD LEFT FOREARM Performed at [facility], [address] [place], [place] [address]    Special Requests   Final    BOTTLES  DRAWN AEROBIC AND ANAEROBIC Blood Culture results may not be optimal due to an excessive volume of blood received in culture bottles Performed at [facility], [address] [place], [place] [address]    Culture   Final    NO GROWTH 4 DAYS Performed at [facility], [place] [address], [place], [place] [address]    Report Status PENDING  Incomplete  Urine Culture     Status: Abnormal   Collection Time: 11/19/21 10:30 AM   Specimen: Urine, Clean Catch  Result Value Ref Range Status   Specimen Description   Final    URINE, CLEAN CATCH Performed at [facility], [address] [place], [place] [address]    Special Requests   Final    NONE Performed at [facility], [address] [place], [place] [address]    Culture >=100,000 COLONIES/mL ESCHERICHIA COLI (A)  Final   Report Status 11/21/2021 FINAL  Final   Organism ID, Bacteria ESCHERICHIA COLI (A)  Final      Susceptibility   Escherichia coli - MIC*    AMPICILLIN <=2 SENSITIVE Sensitive     CEFAZOLIN <=4 SENSITIVE Sensitive  CEFEPIME <=0.12 SENSITIVE Sensitive     CEFTRIAXONE <=0.25 SENSITIVE Sensitive     CIPROFLOXACIN <=0.25 SENSITIVE Sensitive     GENTAMICIN <=1 SENSITIVE Sensitive     IMIPENEM <=0.25 SENSITIVE Sensitive     NITROFURANTOIN <=16 SENSITIVE Sensitive     TRIMETH/SULFA <=20 SENSITIVE Sensitive     AMPICILLIN/SULBACTAM <=2 SENSITIVE Sensitive     PIP/TAZO <=4 SENSITIVE Sensitive     * >=100,000 COLONIES/mL ESCHERICHIA COLI  Resp Panel by RT-PCR (Flu A&B, Covid) Anterior Nasal Swab     Status: None   Collection Time: 11/19/21 10:30 AM   Specimen: Anterior Nasal Swab  Result Value Ref Range Status   SARS Coronavirus 2 by RT PCR NEGATIVE NEGATIVE Final    Comment: (NOTE) SARS-CoV-2 target nucleic acids are NOT DETECTED.  The SARS-CoV-2 RNA is generally detectable in upper respiratory specimens during the acute phase of infection. The lowest concentration of SARS-CoV-2  viral copies this assay can detect is 138 copies/mL. A negative result does not preclude SARS-Cov-2 infection and should not be used as the sole basis for treatment or other patient management decisions. A negative result may occur with  improper specimen collection/handling, submission of specimen other than nasopharyngeal swab, presence of viral mutation(s) within the areas targeted by this assay, and inadequate number of viral copies(<138 copies/mL). A negative result must be combined with clinical observations, patient history, and epidemiological information. The expected result is Negative.  Fact Sheet for Patients:  EntrepreneurPulse.com.au  Fact Sheet for Healthcare Providers:  IncredibleEmployment.be  This test is no t yet approved or cleared by the [place] FDA and  has been authorized for detection and/or diagnosis of SARS-CoV-2 by FDA under an Emergency Use Authorization (EUA). This EUA will remain  in effect (meaning this test can be used) for the duration of the COVID-19 declaration under Section 564(b)(1) of the Act, 21 U.S.C.section 360bbb-3(b)(1), unless the authorization is terminated  or revoked sooner.       Influenza A by PCR NEGATIVE NEGATIVE Final   Influenza B by PCR NEGATIVE NEGATIVE Final    Comment: (NOTE) The Xpert Xpress SARS-CoV-2/FLU/RSV plus assay is intended as an aid in the diagnosis of influenza from Nasopharyngeal swab specimens and should not be used as a sole basis for treatment. Nasal washings and aspirates are unacceptable for Xpert Xpress SARS-CoV-2/FLU/RSV testing.  Fact Sheet for Patients: EntrepreneurPulse.com.au  Fact Sheet for Healthcare Providers: IncredibleEmployment.be  This test is not yet approved or cleared by the [place] FDA and has been authorized for detection and/or diagnosis of SARS-CoV-2 by FDA under an Emergency Use Authorization  (EUA). This EUA will remain in effect (meaning this test can be used) for the duration of the COVID-19 declaration under Section 564(b)(1) of the Act, 21 U.S.C. section 360bbb-3(b)(1), unless the authorization is terminated or revoked.  Performed at [facility], [address] [place], [place] [address]   SARS Coronavirus 2 by RT PCR (hospital order, performed in [facility] hospital lab) *cepheid single result test* Anterior Nasal Swab     Status: None   Collection Time: 11/22/21  9:34 AM   Specimen: Anterior Nasal Swab  Result Value Ref Range Status   SARS Coronavirus 2 by RT PCR NEGATIVE NEGATIVE Final    Comment: (NOTE) SARS-CoV-2 target nucleic acids are NOT DETECTED.  The SARS-CoV-2 RNA is generally detectable in upper and lower respiratory specimens during the acute phase of infection. The lowest concentration of SARS-CoV-2 viral copies this assay can detect is  250 copies / mL. A negative result does not preclude SARS-CoV-2 infection and should not be used as the sole basis for treatment or other patient management decisions.  A negative result may occur with improper specimen collection / handling, submission of specimen other than nasopharyngeal swab, presence of viral mutation(s) within the areas targeted by this assay, and inadequate number of viral copies (<250 copies / mL). A negative result must be combined with clinical observations, patient history, and epidemiological information.  Fact Sheet for Patients:   https://www.patel.info/  Fact Sheet for Healthcare Providers: https://hall.com/  This test is not yet approved or  cleared by the [place] FDA and has been authorized for detection and/or diagnosis of SARS-CoV-2 by FDA under an Emergency Use Authorization (EUA).  This EUA will remain in effect (meaning this test can be used) for the duration of the COVID-19 declaration under Section 564(b)(1) of the  Act, 21 U.S.C. section 360bbb-3(b)(1), unless the authorization is terminated or revoked sooner.  Performed at [facility], [address] [place], [place] [address]     Labs: CBC: Recent Labs  Lab 11/19/21 1030 11/20/21 0347 11/21/21 0523 11/22/21 0506 11/23/21 0551  WBC 18.8* 16.3* 12.9* 8.4 9.5  NEUTROABS 15.0*  --   --   --   --   HGB 12.1* 10.6* 10.5* 10.2* 11.8*  HCT 37.3* 32.5* 31.7* 31.1* 37.6*  MCV 86.3 86.2 85.7 86.4 87.2  PLT 386 363 366 390 456*   Basic Metabolic Panel: Recent Labs  Lab 11/19/21 1030 11/20/21 0347 11/21/21 0523 11/22/21 0506 11/23/21 0551  NA 134* 134* 137 138 136  K 6.2* 5.5* 4.8 4.1 4.6  CL 107 107 103 100 99  CO2 18* 20* _0 GLUCOSE 221* 179* 148* 125* 182*  BUN 41* 37* 36* 35* 37*  CREATININE 3.68* 3.76* 3.36* 3.27* 3.33*  CALCIUM 9.0 8.6* 8.9 8.7* 9.5  MG  --   --   --  1.6* 2.1   Liver Function Tests: Recent Labs  Lab 11/19/21 1030  AST 12*  ALT 19  ALKPHOS 62  BILITOT 1.1  PROT 7.9  ALBUMIN 3.6   CBG: Recent Labs  Lab 11/22/21 0812 11/22/21 1150 11/22/21 1637 11/22/21 2101 11/23/21 0808  GLUCAP 144* 133* 193* 143* 200*    Discharge time spent: greater than 30 minutes.  Signed: Sharen Hones, MD [facility] 11/23/2021

## 2021-11-23 NOTE — Plan of Care (Signed)
Problem: Education: Goal: Ability to describe self-care measures that may prevent or decrease complications (Diabetes Survival Skills Education) will improve 11/23/2021 0948 by Earley Brooke, RN Outcome: Adequate for Discharge 11/23/2021 0947 by Earley Brooke, RN Outcome: Adequate for Discharge Goal: Individualized Educational Video(s) 11/23/2021 0948 by Earley Brooke, RN Outcome: Adequate for Discharge 11/23/2021 0947 by Earley Brooke, RN Outcome: Adequate for Discharge   Problem: Coping: Goal: Ability to adjust to condition or change in health will improve 11/23/2021 0948 by Earley Brooke, RN Outcome: Adequate for Discharge 11/23/2021 0947 by Earley Brooke, RN Outcome: Adequate for Discharge   Problem: Health Behavior/Discharge Planning: Goal: Ability to identify and utilize available resources and services will improve 11/23/2021 0948 by Earley Brooke, RN Outcome: Adequate for Discharge 11/23/2021 0947 by Earley Brooke, RN Outcome: Adequate for Discharge Goal: Ability to manage health-related needs will improve 11/23/2021 0948 by Earley Brooke, RN Outcome: Adequate for Discharge 11/23/2021 0947 by Earley Brooke, RN Outcome: Adequate for Discharge   Problem: Metabolic: Goal: Ability to maintain appropriate glucose levels will improve 11/23/2021 0948 by Earley Brooke, RN Outcome: Adequate for Discharge 11/23/2021 0947 by Earley Brooke, RN Outcome: Adequate for Discharge   Problem: Nutritional: Goal: Maintenance of adequate nutrition will improve 11/23/2021 0948 by Earley Brooke, RN Outcome: Adequate for Discharge 11/23/2021 0947 by Earley Brooke, RN Outcome: Adequate for Discharge Goal: Progress toward achieving an optimal weight will improve 11/23/2021 0948 by Earley Brooke, RN Outcome: Adequate for Discharge 11/23/2021 0947 by Earley Brooke, RN Outcome: Adequate for Discharge   Problem: Nutritional: Goal: Progress toward achieving an optimal weight will improve 11/23/2021 0948 by Earley Brooke, RN Outcome:  Adequate for Discharge 11/23/2021 0947 by Earley Brooke, RN Outcome: Adequate for Discharge   Problem: Skin Integrity: Goal: Risk for impaired skin integrity will decrease 11/23/2021 0948 by Earley Brooke, RN Outcome: Adequate for Discharge 11/23/2021 0947 by Earley Brooke, RN Outcome: Adequate for Discharge   Problem: Education: Goal: Knowledge of General Education information will improve Description: Including pain rating scale, medication(s)/side effects and non-pharmacologic comfort measures 11/23/2021 0948 by Earley Brooke, RN Outcome: Adequate for Discharge 11/23/2021 0947 by Earley Brooke, RN Outcome: Adequate for Discharge   Problem: Tissue Perfusion: Goal: Adequacy of tissue perfusion will improve 11/23/2021 0948 by Earley Brooke, RN Outcome: Adequate for Discharge 11/23/2021 0947 by Earley Brooke, RN Outcome: Adequate for Discharge   Problem: Health Behavior/Discharge Planning: Goal: Ability to manage health-related needs will improve 11/23/2021 0948 by Earley Brooke, RN Outcome: Adequate for Discharge 11/23/2021 0947 by Earley Brooke, RN Outcome: Adequate for Discharge   Problem: Clinical Measurements: Goal: Cardiovascular complication will be avoided 11/23/2021 0948 by Earley Brooke, RN Outcome: Adequate for Discharge 11/23/2021 0947 by Earley Brooke, RN Outcome: Adequate for Discharge   Problem: Clinical Measurements: Goal: Respiratory complications will improve 11/23/2021 0948 by Earley Brooke, RN Outcome: Adequate for Discharge 11/23/2021 0947 by Earley Brooke, RN Outcome: Adequate for Discharge   Problem: Clinical Measurements: Goal: Diagnostic test results will improve 11/23/2021 0948 by Earley Brooke, RN Outcome: Adequate for Discharge 11/23/2021 0947 by Earley Brooke, RN Outcome: Adequate for Discharge   Problem: Elimination: Goal: Will not experience complications related to bowel motility 11/23/2021 0948 by Earley Brooke, RN Outcome: Adequate for Discharge 11/23/2021 0947 by Earley Brooke, RN Outcome: Adequate for Discharge Goal: Will not experience complications related to urinary retention 11/23/2021 0948 by Earley Brooke, RN Outcome: Adequate for Discharge 11/23/2021 0947 by Earley Brooke, RN Outcome: Adequate for Discharge   Problem: Skin Integrity: Goal: Risk for impaired skin  integrity will decrease 11/23/2021 0948 by Earley Brooke, RN Outcome: Adequate for Discharge 11/23/2021 0947 by Earley Brooke, RN Outcome: Adequate for Discharge

## 2021-11-23 NOTE — Plan of Care (Signed)
  Problem: Education: Goal: Ability to describe self-care measures that may prevent or decrease complications (Diabetes Survival Skills Education) will improve Outcome: Adequate for Discharge Goal: Individualized Educational Video(s) Outcome: Adequate for Discharge   Problem: Coping: Goal: Ability to adjust to condition or change in health will improve Outcome: Adequate for Discharge   Problem: Fluid Volume: Goal: Ability to maintain a balanced intake and output will improve Outcome: Adequate for Discharge   Problem: Health Behavior/Discharge Planning: Goal: Ability to identify and utilize available resources and services will improve Outcome: Adequate for Discharge Goal: Ability to manage health-related needs will improve Outcome: Adequate for Discharge   Problem: Metabolic: Goal: Ability to maintain appropriate glucose levels will improve Outcome: Adequate for Discharge   Problem: Nutritional: Goal: Maintenance of adequate nutrition will improve Outcome: Adequate for Discharge Goal: Progress toward achieving an optimal weight will improve Outcome: Adequate for Discharge   Problem: Skin Integrity: Goal: Risk for impaired skin integrity will decrease Outcome: Adequate for Discharge   Problem: Tissue Perfusion: Goal: Adequacy of tissue perfusion will improve Outcome: Adequate for Discharge   Problem: Education: Goal: Knowledge of General Education information will improve Description: Including pain rating scale, medication(s)/side effects and non-pharmacologic comfort measures Outcome: Adequate for Discharge   Problem: Health Behavior/Discharge Planning: Goal: Ability to manage health-related needs will improve Outcome: Adequate for Discharge   Problem: Clinical Measurements: Goal: Ability to maintain clinical measurements within normal limits will improve Outcome: Adequate for Discharge Goal: Will remain free from infection Outcome: Adequate for Discharge Goal:  Diagnostic test results will improve Outcome: Adequate for Discharge Goal: Respiratory complications will improve Outcome: Adequate for Discharge Goal: Cardiovascular complication will be avoided Outcome: Adequate for Discharge   Problem: Activity: Goal: Risk for activity intolerance will decrease Outcome: Adequate for Discharge   Problem: Nutrition: Goal: Adequate nutrition will be maintained Outcome: Adequate for Discharge   Problem: Coping: Goal: Level of anxiety will decrease Outcome: Adequate for Discharge   Problem: Elimination: Goal: Will not experience complications related to bowel motility Outcome: Adequate for Discharge Goal: Will not experience complications related to urinary retention Outcome: Adequate for Discharge   Problem: Safety: Goal: Ability to remain free from injury will improve Outcome: Adequate for Discharge   Problem: Skin Integrity: Goal: Risk for impaired skin integrity will decrease Outcome: Adequate for Discharge

## 2021-11-24 LAB — CULTURE, BLOOD (ROUTINE X 2)
Culture: NO GROWTH
Culture: NO GROWTH
Special Requests: ADEQUATE

## 2021-11-30 ENCOUNTER — Encounter: Payer: Self-pay | Admitting: Urology

## 2021-11-30 ENCOUNTER — Ambulatory Visit (INDEPENDENT_AMBULATORY_CARE_PROVIDER_SITE_OTHER): Payer: Managed Care, Other (non HMO) | Admitting: Urology

## 2021-11-30 VITALS — BP 145/87 | HR 90 | Ht 69.0 in | Wt 218.0 lb

## 2021-11-30 DIAGNOSIS — N2889 Other specified disorders of kidney and ureter: Secondary | ICD-10-CM

## 2021-11-30 DIAGNOSIS — Z87440 Personal history of urinary (tract) infections: Secondary | ICD-10-CM | POA: Insufficient documentation

## 2021-11-30 DIAGNOSIS — R310 Gross hematuria: Secondary | ICD-10-CM

## 2021-11-30 DIAGNOSIS — Q612 Polycystic kidney, adult type: Secondary | ICD-10-CM | POA: Diagnosis not present

## 2021-11-30 LAB — URINALYSIS, ROUTINE W REFLEX MICROSCOPIC
Bilirubin, UA: NEGATIVE
Glucose, UA: NEGATIVE
Ketones, UA: NEGATIVE
Leukocytes,UA: NEGATIVE
Nitrite, UA: NEGATIVE
Specific Gravity, UA: 1.015 (ref 1.005–1.030)
Urobilinogen, Ur: 0.2 mg/dL (ref 0.2–1.0)
pH, UA: 6 (ref 5.0–7.5)

## 2021-11-30 LAB — MICROSCOPIC EXAMINATION: Bacteria, UA: NONE SEEN
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
## 2021-12-09 ENCOUNTER — Ambulatory Visit
Admission: RE | Admit: 2021-12-09 | Discharge: 2021-12-09 | Disposition: A | Discharge status: Home / Self Care | Payer: Medicaid Other | Source: Ambulatory Visit | Attending: Urology | Admitting: Urology

## 2021-12-09 DIAGNOSIS — Q612 Polycystic kidney, adult type: Secondary | ICD-10-CM | POA: Diagnosis present

## 2021-12-09 DIAGNOSIS — N2889 Other specified disorders of kidney and ureter: Secondary | ICD-10-CM | POA: Insufficient documentation

## 2021-12-09 MED ORDER — GADOBUTROL 1 MMOL/ML IV SOLN
9.0000 mL | Freq: Once | INTRAVENOUS | Status: AC | PRN
  Administered 2021-12-09: 9 mL via INTRAVENOUS

## 2021-12-28 ENCOUNTER — Other Ambulatory Visit
Admission: RE | Admit: 2021-12-28 | Discharge: 2021-12-28 | Disposition: A | Discharge status: Home / Self Care | Payer: Managed Care, Other (non HMO) | Source: Ambulatory Visit | Attending: Family Medicine | Admitting: Family Medicine

## 2021-12-28 DIAGNOSIS — R002 Palpitations: Secondary | ICD-10-CM | POA: Insufficient documentation

## 2021-12-28 DIAGNOSIS — R079 Chest pain, unspecified: Secondary | ICD-10-CM | POA: Insufficient documentation

## 2021-12-28 LAB — TROPONIN I (HIGH SENSITIVITY): Troponin I (High Sensitivity): 7 ng/L (ref <18)

## 2022-01-21 ENCOUNTER — Ambulatory Visit (INDEPENDENT_AMBULATORY_CARE_PROVIDER_SITE_OTHER): Payer: Medicaid Other | Admitting: Urology

## 2022-01-21 ENCOUNTER — Encounter: Payer: Self-pay | Admitting: Urology

## 2022-01-21 ENCOUNTER — Other Ambulatory Visit: Payer: Self-pay

## 2022-01-21 VITALS — BP 160/100 | HR 92 | Ht 69.0 in | Wt 219.0 lb

## 2022-01-21 DIAGNOSIS — Q612 Polycystic kidney, adult type: Secondary | ICD-10-CM

## 2022-01-21 DIAGNOSIS — Z87440 Personal history of urinary (tract) infections: Secondary | ICD-10-CM | POA: Diagnosis not present

## 2022-01-21 DIAGNOSIS — R310 Gross hematuria: Secondary | ICD-10-CM

## 2022-01-21 LAB — URINALYSIS
Bilirubin, UA: NEGATIVE
Glucose, UA: 100 mg/dL — AB
Ketones, UA: NEGATIVE
Leukocytes, UA: NEGATIVE
Nitrite, UA: NEGATIVE
Protein, UA: POSITIVE — AB
Spec Grav, UA: 1.025 (ref 1.010–1.025)
Urobilinogen, UA: 0.2 E.U./dL
pH, UA: 5.5 (ref 5.0–8.0)

## 2022-01-21 MED ORDER — CIPROFLOXACIN HCL 500 MG PO TABS
500.0000 mg | ORAL_TABLET | Freq: Once | ORAL | Status: AC
  Administered 2022-01-21: 500 mg via ORAL
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
## 2022-01-21 NOTE — Progress Notes (Signed)
Hematuria cytology tracking # [id number]. Confirmation # [id number].

## 2022-02-01 ENCOUNTER — Encounter: Payer: Self-pay | Admitting: Urology

## 2022-02-03 ENCOUNTER — Telehealth: Payer: Self-pay | Admitting: Urology

## 2022-02-03 NOTE — Telephone Encounter (Signed)
Hematuria profile from 01/21/2022 was negative for malignant cells or dysplasia.  Reactive cellular changes noted with rare to few renal fragments indicating ischemic necrosis, blood and/or erythrocyte casts indicating glomerular and/or renal tubular bleeding, crystal urea, moderate inflammation.  Proteinuria and elevated beta microglobulin consistent with tubular dysfunction also noted.  Message left for patient regarding results. Patient with a diagnosis of adult polycystic kidney disease.

## 2022-02-23 ENCOUNTER — Other Ambulatory Visit: Payer: Self-pay | Admitting: Internal Medicine

## 2022-02-23 DIAGNOSIS — R932 Abnormal findings on diagnostic imaging of liver and biliary tract: Secondary | ICD-10-CM

## 2022-02-23 DIAGNOSIS — K7689 Other specified diseases of liver: Secondary | ICD-10-CM

## 2022-02-27 ENCOUNTER — Ambulatory Visit
Admission: RE | Admit: 2022-02-27 | Discharge: 2022-02-27 | Disposition: A | Discharge status: Home / Self Care | Payer: Medicaid Other | Source: Ambulatory Visit | Attending: Internal Medicine | Admitting: Internal Medicine

## 2022-02-27 DIAGNOSIS — K7689 Other specified diseases of liver: Secondary | ICD-10-CM

## 2022-02-27 DIAGNOSIS — R932 Abnormal findings on diagnostic imaging of liver and biliary tract: Secondary | ICD-10-CM | POA: Diagnosis present

## 2022-02-27 MED ORDER — GADOBUTROL 1 MMOL/ML IV SOLN
10.0000 mL | Freq: Once | INTRAVENOUS | Status: AC | PRN
  Administered 2022-02-27: 10 mL via INTRAVENOUS

## 2022-03-06 IMAGING — CR DG CHEST 2V
1 series · 2 of 2 positions shown · non-contrast
Comparison: No prior.

CLINICAL DATA: Cough.

EXAM:
CHEST - 2 VIEW

[Series 1: dg chest 2 view · 0.14mm/px · 2 of 2 slices shown]
[im 1/2]
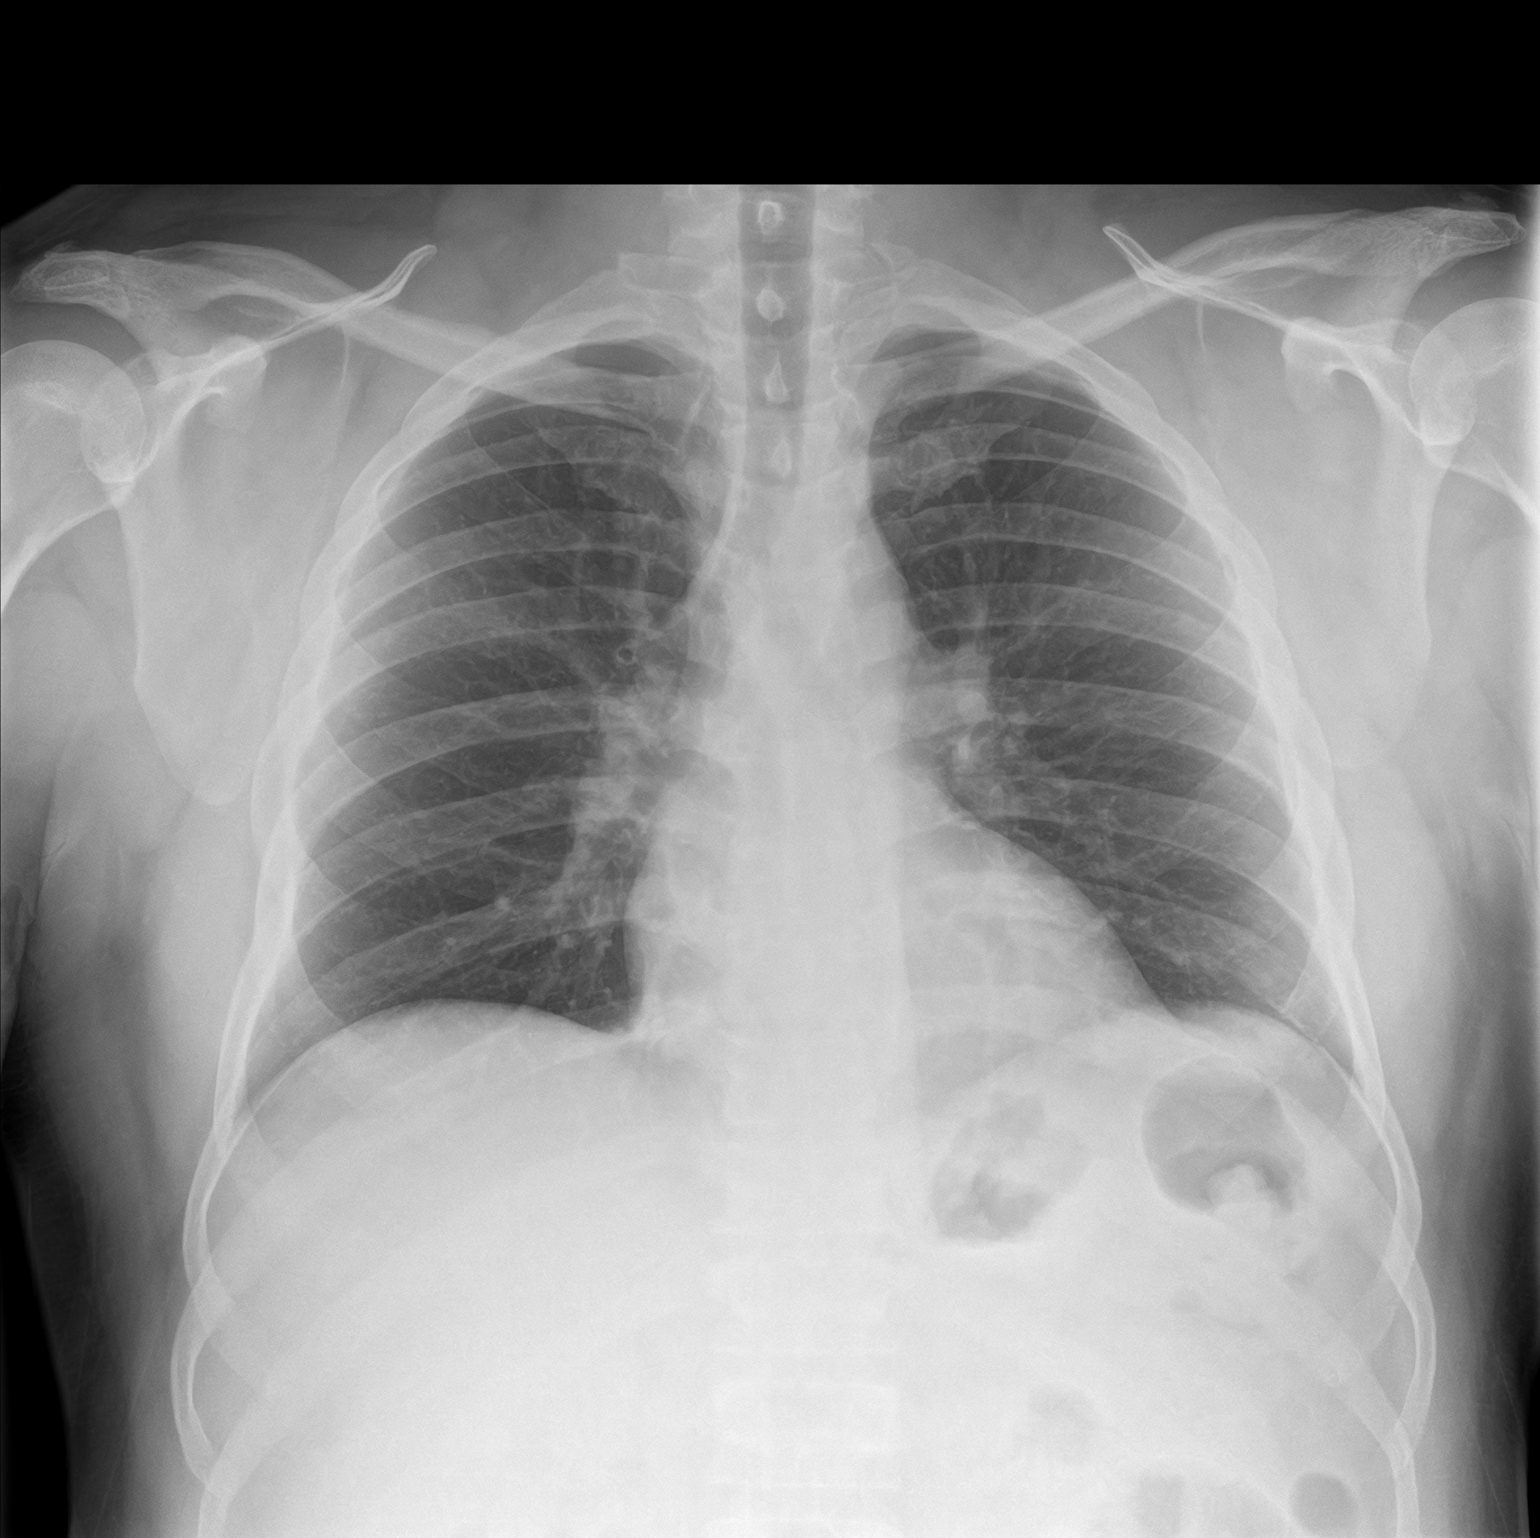
[im 2/2]
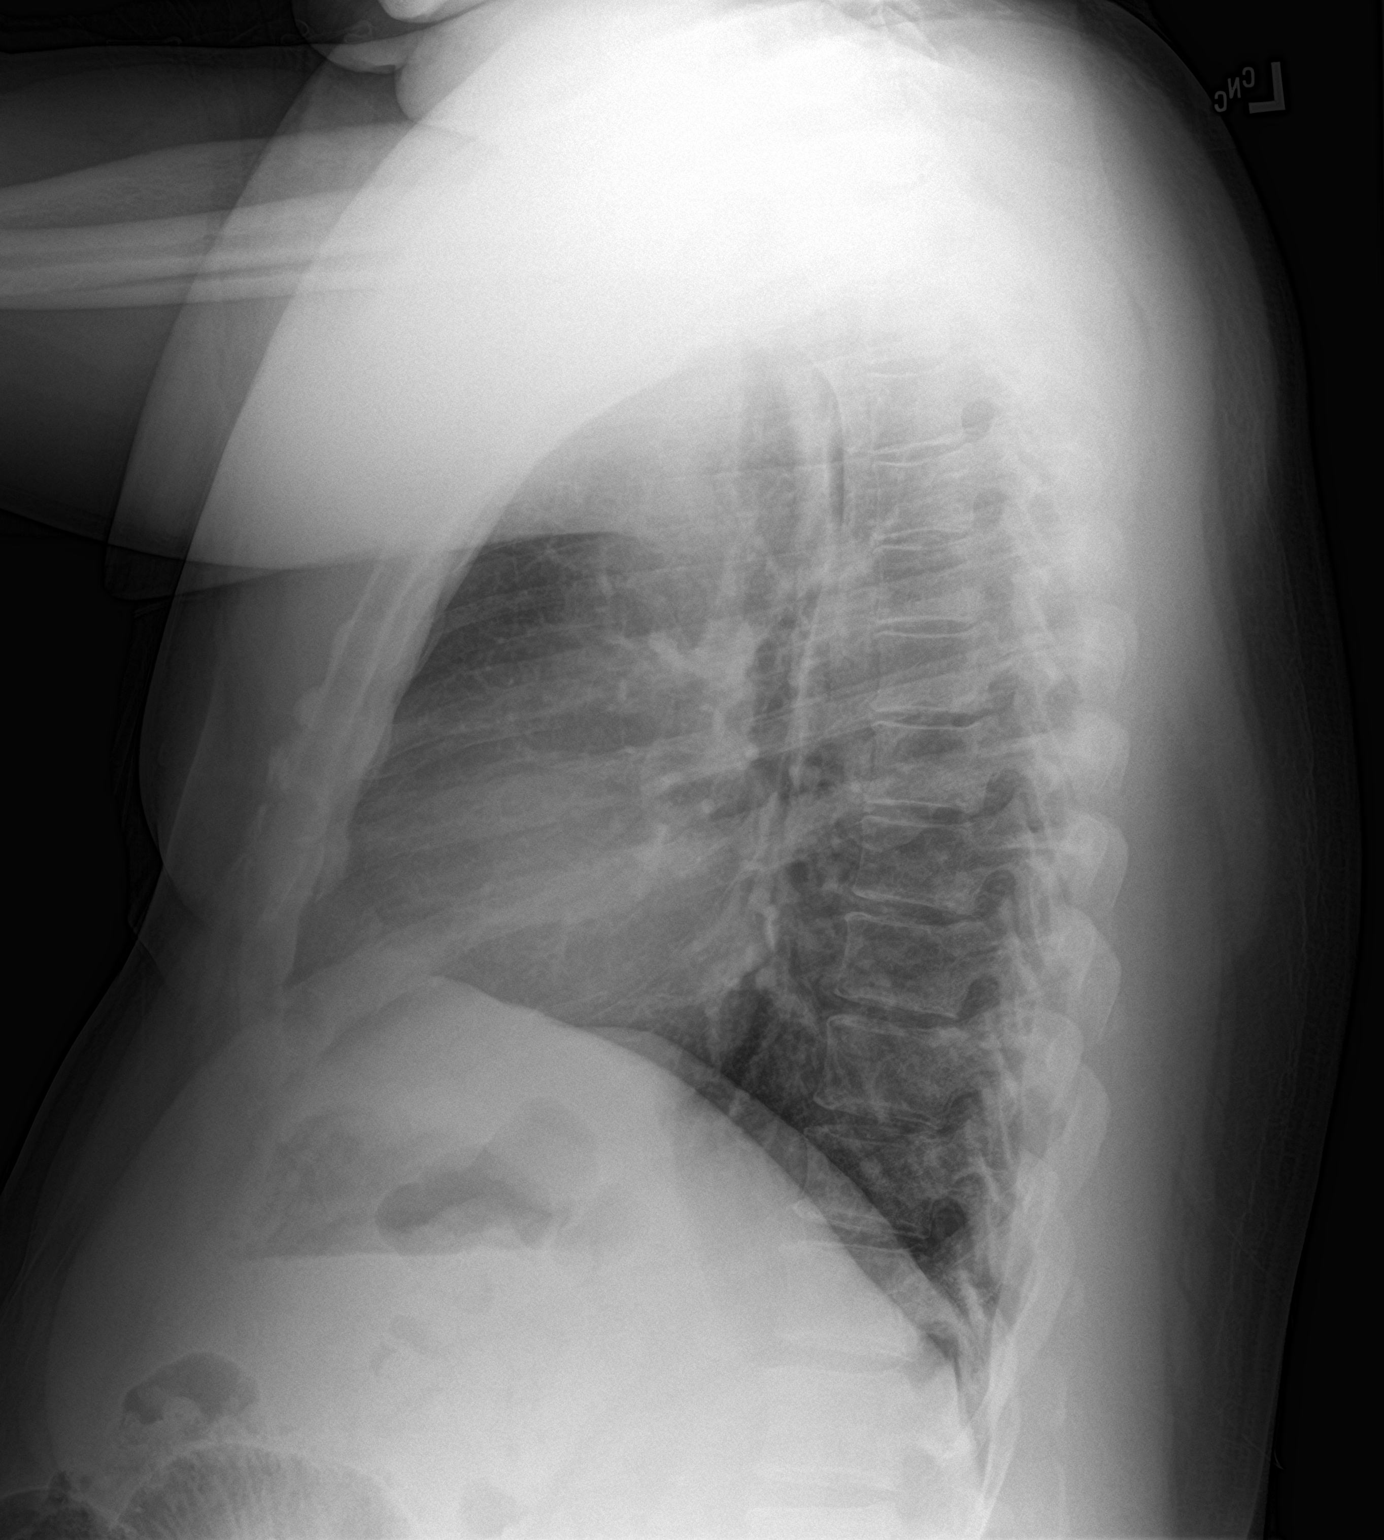

[2 of 2 positions shown; findings below may reference images not displayed]

FINDINGS: Mediastinum and hilar structures normal. Mild peribronchial cuffing.
Bronchitis cannot be excluded. Low lung volumes. Heart size normal.
No focal infiltrate. No pleural effusion or pneumothorax.
Degenerative change thoracic spine.
IMPRESSION: Low lung volumes. Mild peribronchial cuffing. Bronchitis cannot be
excluded. No acute cardiopulmonary disease.

## 2022-07-26 ENCOUNTER — Ambulatory Visit: Payer: Managed Care, Other (non HMO) | Admitting: Urology
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
## 2022-08-17 IMAGING — CT CT HEAD W/O CM
3 series · 16 of 47 positions shown, 19 images · non-contrast
Comparison: None.

CLINICAL DATA: Left-sided headache

EXAM:
CT HEAD WITHOUT CONTRAST
TECHNIQUE: Contiguous axial images were obtained from the base of the skull
through the vertex without intravenous contrast.

[Series 2: head wo · axial · 0.40mm/px · z∈[-171,-46]mm · 10 of 31 slices shown, 13 images]
[im 3/31  brain]
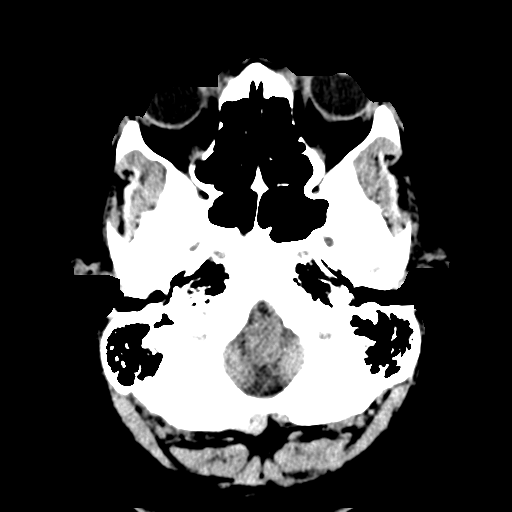
[im 3/31  bone]
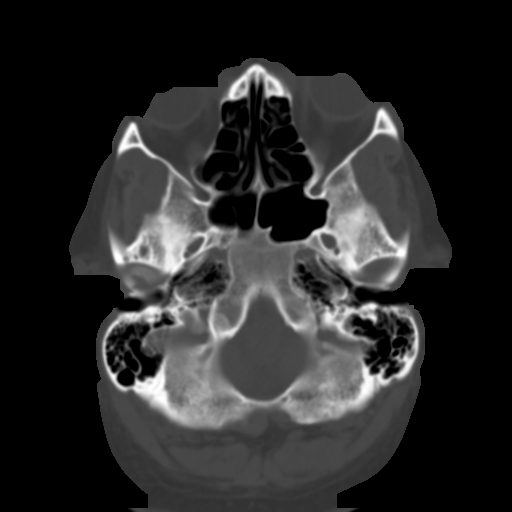
[im 6/31  brain]
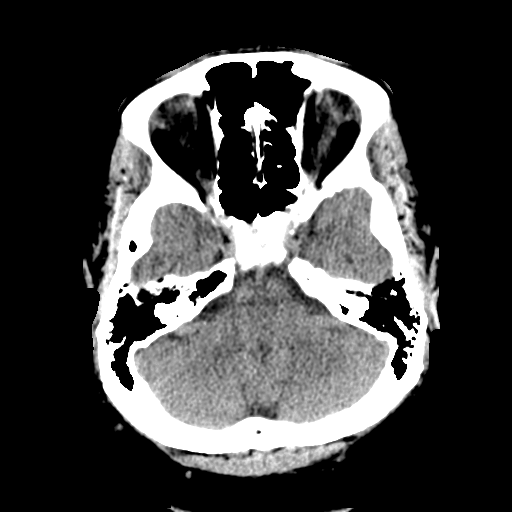
[im 9/31  brain]
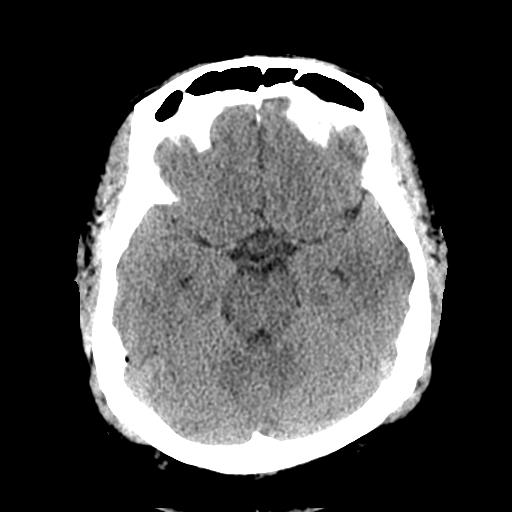
[im 11/31  brain]
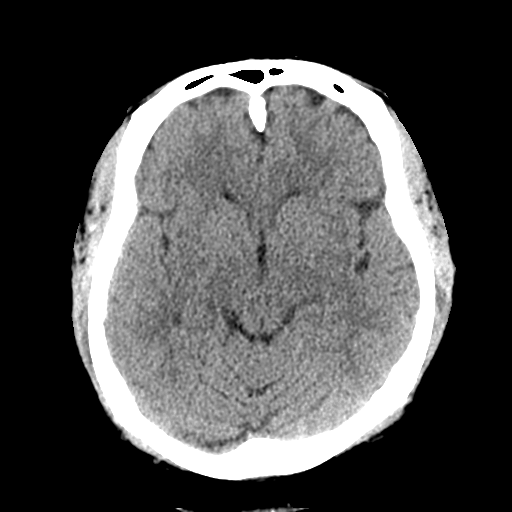
[im 14/31  brain]
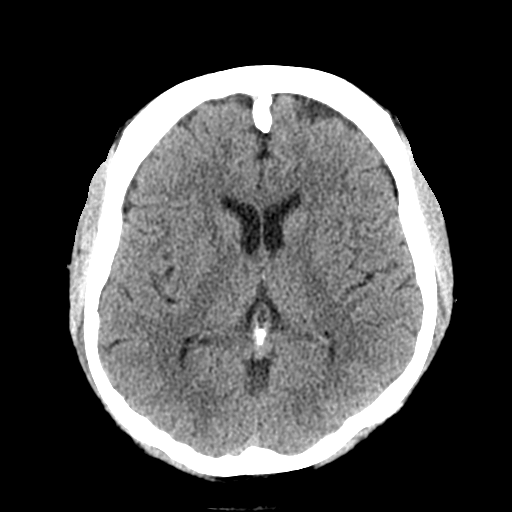
[im 14/31  bone]
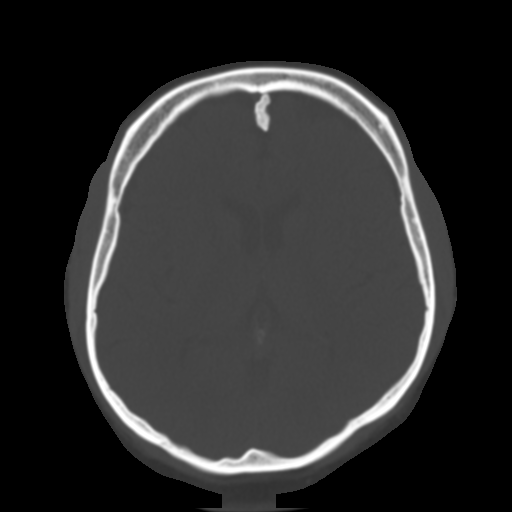
[im 17/31  brain]
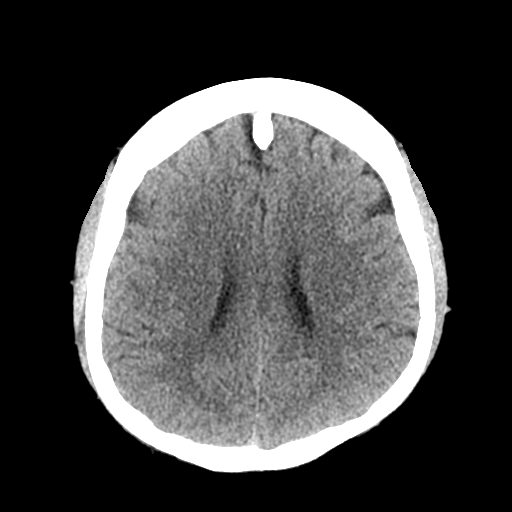
[im 20/31  brain]
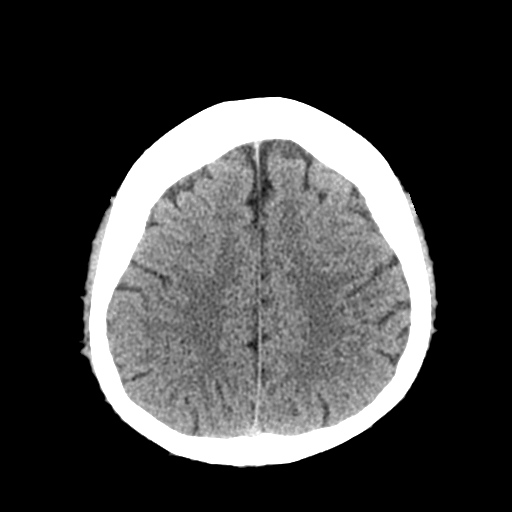
[im 23/31  brain]
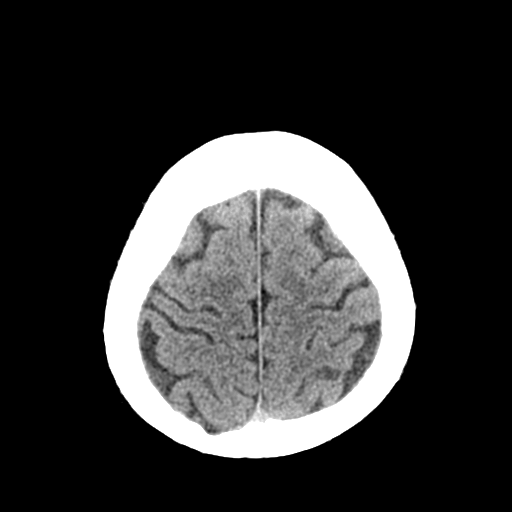
[im 25/31  brain]
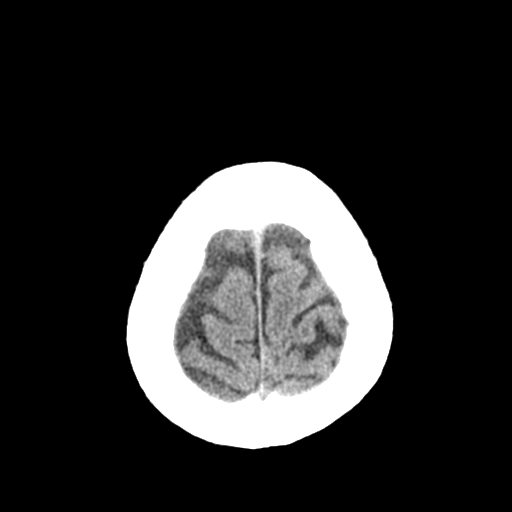
[im 25/31  bone]
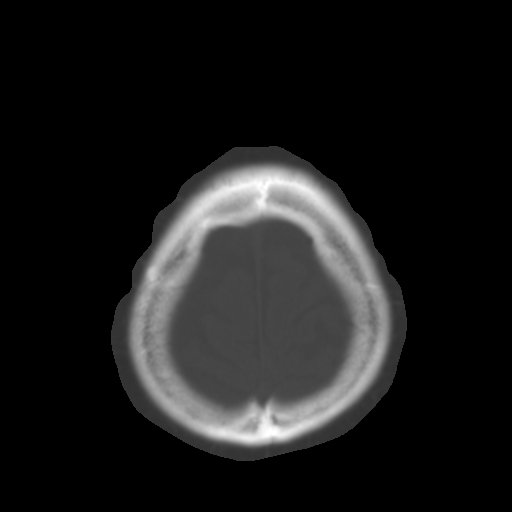
[im 28/31  brain]
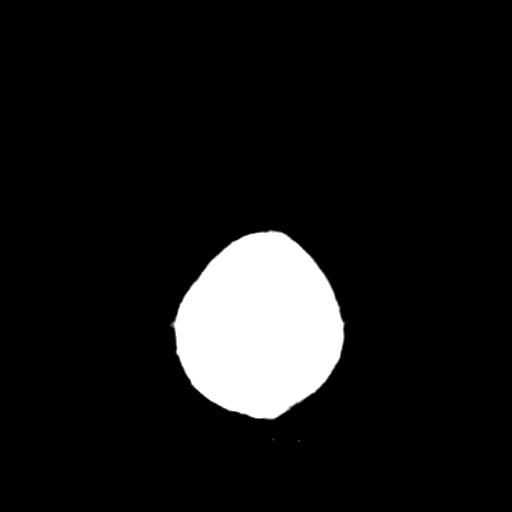

[Series 4: coronal soft tissue · coronal · 0.31mm/px · 3 of 66 slices shown]
[im 22/66  brain]
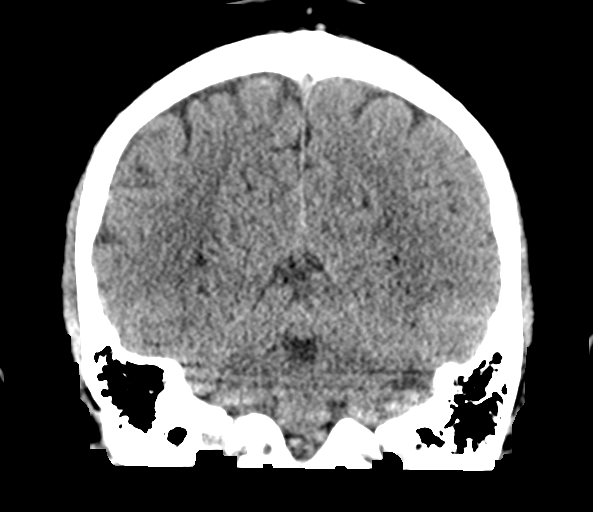
[im 29/66  brain]
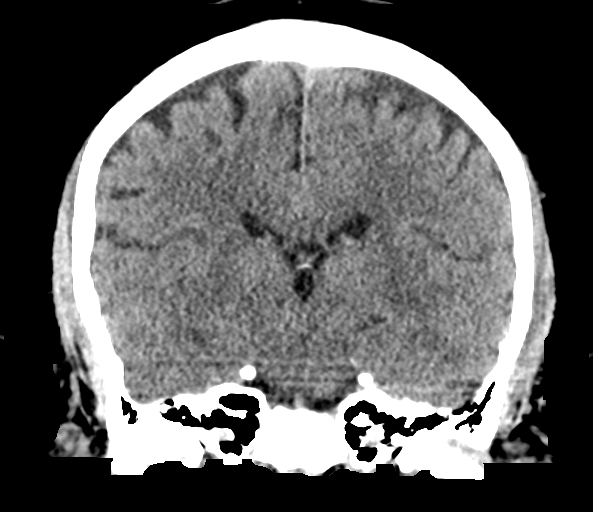
[im 37/66  brain]
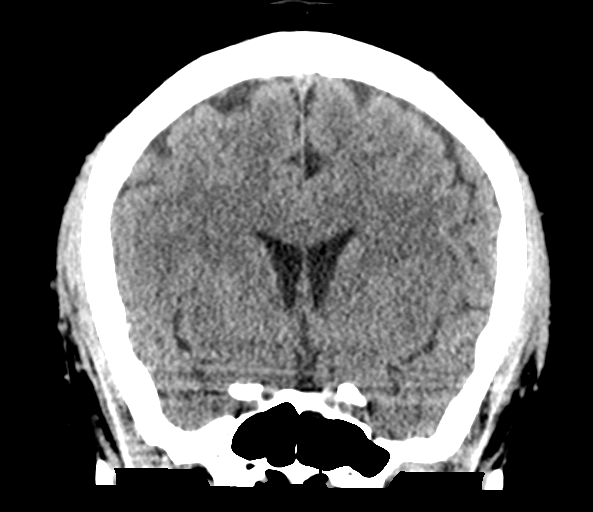

[Series 5: sagittal soft tissue · sagittal · 0.31mm/px · 3 of 64 slices shown]
[im 22/64  brain]
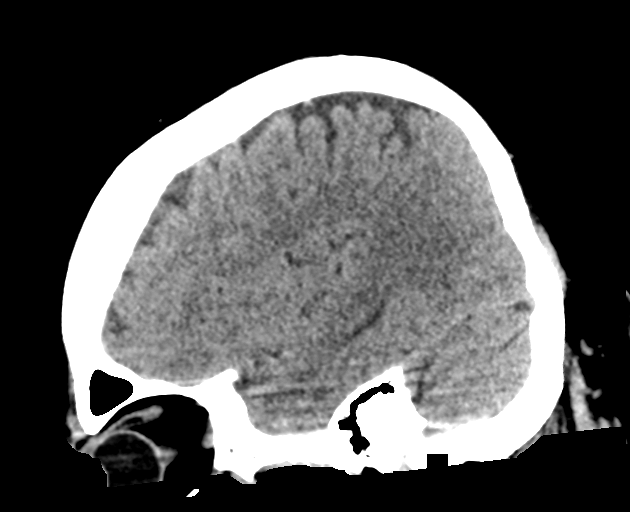
[im 32/64  brain]
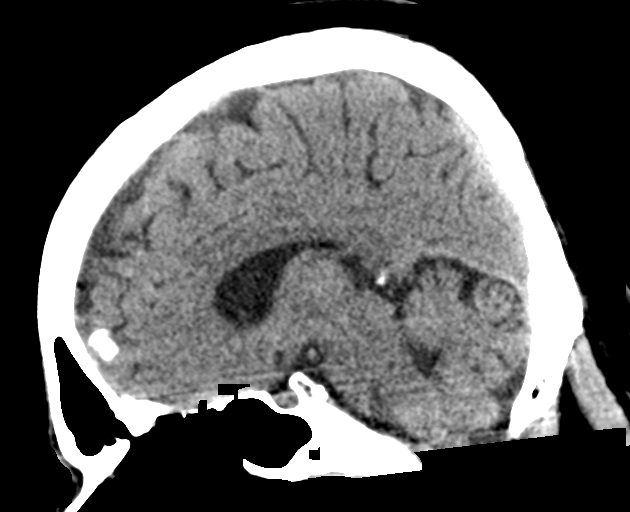
[im 43/64  brain]
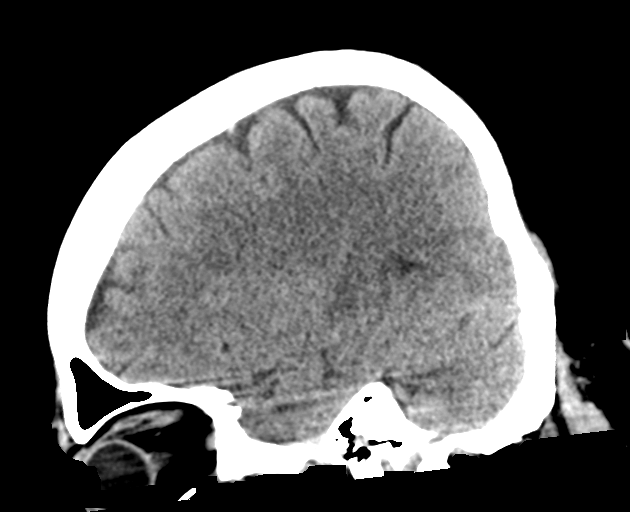

[16 of 47 positions shown; findings below may reference images not displayed]

FINDINGS: Brain: No evidence of acute infarction, hemorrhage, hydrocephalus,
extra-axial collection or mass lesion/mass effect.

Vascular: No hyperdense vessel or unexpected calcification.

Skull: Normal. Negative for fracture or focal lesion.

Sinuses/Orbits: No acute finding.

Other: None.
IMPRESSION: No acute intracranial findings.
